# Patient Record
Sex: Male | Born: 1970 | Race: White | Hispanic: No | Marital: Married | Smoking: Light tobacco smoker
Health system: Southern US, Community
[De-identification: ages and names within clinical notes are randomized; demographics above are authoritative.]

## PROBLEM LIST (undated history)

## (undated) DIAGNOSIS — E785 Hyperlipidemia, unspecified: Secondary | ICD-10-CM

## (undated) DIAGNOSIS — Z5189 Encounter for other specified aftercare: Secondary | ICD-10-CM

## (undated) DIAGNOSIS — K219 Gastro-esophageal reflux disease without esophagitis: Secondary | ICD-10-CM

## (undated) HISTORY — DX: Encounter for other specified aftercare: Z51.89

## (undated) HISTORY — DX: Gastro-esophageal reflux disease without esophagitis: K21.9

## (undated) HISTORY — PX: HAND SURGERY: SHX662

## (undated) HISTORY — PX: KNEE ARTHROSCOPY W/ MENISCAL REPAIR: SHX1877

## (undated) HISTORY — DX: Hyperlipidemia, unspecified: E78.5

---

## 2015-05-23 ENCOUNTER — Telehealth: Payer: Self-pay | Admitting: *Deleted

## 2015-05-23 ENCOUNTER — Encounter: Payer: Self-pay | Admitting: *Deleted

## 2015-05-23 NOTE — Addendum Note (Signed)
Addended by: Noreene Larsson A on: 05/23/2015 04:29 PM   Modules accepted: Medications

## 2015-05-23 NOTE — Telephone Encounter (Signed)
Pre-Visit Call completed with patient and chart updated.   Pre-Visit Info documented in Specialty Comments under SnapShot.    

## 2015-05-23 NOTE — Telephone Encounter (Signed)
Unable to reach patient at time of Pre-Visit Call.  Left message for patient to return call when available.    

## 2015-05-24 ENCOUNTER — Encounter: Payer: Self-pay | Admitting: Medical

## 2015-05-24 ENCOUNTER — Telehealth: Payer: Self-pay | Admitting: Medical

## 2015-05-24 ENCOUNTER — Ambulatory Visit (HOSPITAL_BASED_OUTPATIENT_CLINIC_OR_DEPARTMENT_OTHER)
Admission: RE | Admit: 2015-05-24 | Discharge: 2015-05-24 | Disposition: A | Discharge status: Home / Self Care | Payer: Federal, State, Local not specified - PPO | Source: Ambulatory Visit | Attending: Medical | Admitting: Medical

## 2015-05-24 ENCOUNTER — Ambulatory Visit (INDEPENDENT_AMBULATORY_CARE_PROVIDER_SITE_OTHER): Payer: Federal, State, Local not specified - PPO | Admitting: Medical

## 2015-05-24 VITALS — BP 120/68 | HR 68 | Temp 98.8°F | Ht 72.2 in | Wt 205.8 lb

## 2015-05-24 DIAGNOSIS — Z8719 Personal history of other diseases of the digestive system: Secondary | ICD-10-CM | POA: Insufficient documentation

## 2015-05-24 DIAGNOSIS — K219 Gastro-esophageal reflux disease without esophagitis: Secondary | ICD-10-CM | POA: Insufficient documentation

## 2015-05-24 DIAGNOSIS — R0781 Pleurodynia: Secondary | ICD-10-CM | POA: Insufficient documentation

## 2015-05-24 DIAGNOSIS — F17200 Nicotine dependence, unspecified, uncomplicated: Secondary | ICD-10-CM

## 2015-05-24 DIAGNOSIS — R0789 Other chest pain: Secondary | ICD-10-CM | POA: Diagnosis not present

## 2015-05-24 DIAGNOSIS — Z720 Tobacco use: Secondary | ICD-10-CM

## 2015-05-24 HISTORY — DX: Nicotine dependence, unspecified, uncomplicated: F17.200

## 2015-05-24 HISTORY — DX: Other chest pain: R07.89

## 2015-05-24 HISTORY — DX: Personal history of other diseases of the digestive system: Z87.19

## 2015-05-24 HISTORY — DX: Pleurodynia: R07.81

## 2015-05-24 LAB — CBC WITH DIFFERENTIAL/PLATELET
Basophils Absolute: 0.1 10*3/uL (ref 0.0–0.1)
Basophils Relative: 0.9 % (ref 0.0–3.0)
Eosinophils Absolute: 0.3 10*3/uL (ref 0.0–0.7)
Eosinophils Relative: 3 % (ref 0.0–5.0)
HCT: 46.1 % (ref 39.0–52.0)
Hemoglobin: 15.4 g/dL (ref 13.0–17.0)
Lymphocytes Relative: 32.8 % (ref 12.0–46.0)
Lymphs Abs: 2.9 10*3/uL (ref 0.7–4.0)
MCHC: 33.4 g/dL (ref 30.0–36.0)
MCV: 94 fl (ref 78.0–100.0)
Monocytes Absolute: 0.7 10*3/uL (ref 0.1–1.0)
Monocytes Relative: 8 % (ref 3.0–12.0)
Neutro Abs: 4.9 10*3/uL (ref 1.4–7.7)
Neutrophils Relative %: 55.3 % (ref 43.0–77.0)
Platelets: 205 10*3/uL (ref 150.0–400.0)
RBC: 4.91 Mil/uL (ref 4.22–5.81)
RDW: 13.3 % (ref 11.5–15.5)
WBC: 8.9 10*3/uL (ref 4.0–10.5)

## 2015-05-24 LAB — TROPONIN I: TNIDX: 0.02 ug/l (ref 0.00–0.06)

## 2015-05-24 MED ORDER — AZITHROMYCIN 250 MG PO TABS: ORAL_TABLET | ORAL | Status: DC

## 2015-05-24 MED ORDER — DICLOFENAC SODIUM 75 MG PO TBEC: 75.0000 mg | DELAYED_RELEASE_TABLET | Freq: Two times a day (BID) | ORAL | Status: DC

## 2015-05-24 MED ORDER — BUPROPION HCL ER (SR) 150 MG PO TB12: ORAL_TABLET | ORAL | Status: DC

## 2015-05-24 NOTE — Assessment & Plan Note (Addendum)
Rib xrays and cxr. Diclofenac rx.

## 2015-05-24 NOTE — Progress Notes (Signed)
Pre visit review using our clinic review tool, if applicable. No additional management support is needed unless otherwise documented below in the visit note. 

## 2015-05-24 NOTE — Assessment & Plan Note (Addendum)
Stool cards x 3 for blood. If+ then GI referal. Cbc today.

## 2015-05-24 NOTE — Progress Notes (Signed)
Subjective:    Patient ID: Mark Duncan, male    DOB: 1971-07-21, 44 y.o.   MRN: [id number]  HPI  I have reviewed pt PMH, PSH, FH, Social History and Surgical History  Possible transfusion. Pt had left wrist surgery from trauma. He was 44 yrs old and general surgery to repaired  Lt wrist surgery- He had good outcome. Good rom. Good function.   Lt side wrist- sometimes at night he wakes up and feels numb. For couple of years. He is rt handed.  Gerd- Pt has been on med for couple of years. Initially when he had symptoms he had cardologist work up to make sure not cardiac. Pt had echo and stress test. Those test did appear to be negative. Pt states when he took zantac regular basis would control symptoms for most part. But then 6 months ago got atypical lt rib area pain.   Pt states last 6 months some mild constant pain left axillary pain and shoulder pain. So he went back to cardiologist and had repeat stress test and blood work. He states he ran treadmilll out. Pt will have calcium score test. But he did not get test done.   2 uncles had Mi. One before 80 yo. Other late 30's(but younger one used drugs)  Sometimes pain random at rest. Usually not with actitivity.  Pt has faint tenderness to touch. Pain directly over rib. Pt just moved to area 7 wks ago.   Pt prior cardiologist [facility]. Pt will see him for calcium  Pt all sports umpire, active, coffee, married.  Pt has no high cholesterol  Smoke- 1 pack a day. In past 2 yrs. Overall 24 yr smoker.  Also some occasional bright red blood in stool every other day for about a year.   Past Medical History  Diagnosis Date  . Blood transfusion without reported diagnosis   . GERD (gastroesophageal reflux disease)   . Hyperlipidemia     History   Social History  . Marital Status: Married    Spouse Name: N/A  . Number of Children: N/A  . Years of Education: N/A   Occupational History  . Not on file.   Social  History Main Topics  . Smoking status: Light Tobacco Smoker  . Smokeless tobacco: Former Neurosurgeon  . Alcohol Use: No  . Drug Use: Not on file  . Sexual Activity: Not on file   Other Topics Concern  . Not on file   Social History Narrative    Past Surgical History  Procedure Laterality Date  . Hand surgery    . Knee surgery Right   . Hand surgery      Glass removed  . Knee arthroscopy w/ meniscal repair      Family History  Problem Relation Age of Onset  . Heart disease Maternal Uncle   . Heart disease Maternal Grandmother     No Known Allergies  Current Outpatient Prescriptions on File Prior to Visit  Medication Sig Dispense Refill  . ranitidine (ZANTAC) 150 MG capsule Take 150 mg by mouth 2 (two) times daily.     No current facility-administered medications on file prior to visit.    BP 120/68 mmHg  Pulse 68  Temp(Src) 98.8 F (37.1 C) (Oral)  Ht 6' 0.2" (1.834 m)  Wt 205 lb 12.8 oz (93.35 kg)  BMI 27.75 kg/m2  SpO2 99%    Review of Systems  Constitutional: Negative for fever, chills, diaphoresis, activity change and fatigue.  Respiratory: Negative for cough, chest tightness and shortness of breath.   Cardiovascular: Negative for chest pain, palpitations and leg swelling.  Gastrointestinal: Negative for nausea, vomiting and abdominal pain.  Musculoskeletal: Negative for neck pain and neck stiffness.       Lt rib pain under his axillary.  Neurological: Negative for dizziness, tremors, seizures, syncope, facial asymmetry, speech difficulty, weakness, light-headedness, numbness and headaches.  Psychiatric/Behavioral: Negative for behavioral problems, confusion and agitation. The patient is not nervous/anxious.        Objective:   Physical Exam  General Mental Status- Alert. General Appearance- Not in acute distress.   Skin General: Color- Normal Color. Moisture- Normal Moisture.  Neck Carotid Arteries- Normal color. Moisture- Normal Moisture. No carotid  bruits. No JVD.  Chest and Lung Exam Auscultation: Breath Sounds:-Normal.  Cardiovascular Auscultation:Rythm- Regular. Murmurs & Other Heart Sounds:Auscultation of the heart reveals- No Murmurs.  Abdomen Inspection:-Inspeection Normal. Palpation/Percussion:Note:No mass. Palpation and Percussion of the abdomen reveal- Non Tender, Non Distended + BS, no rebound or guarding.    Neurologic Cranial Nerve exam:- CN III-XII intact(No nystagmus), symmetric smile. Strength:- 5/5 equal and symmetric strength both upper and lower extremities.  Lt shoulder- from. No pain on palpation. Thorax- no pain on palpation.  Lt axillary area no tenderness to papation. But left side rib mid lt axillary area mild tender.      Assessment & Plan:

## 2015-05-24 NOTE — Patient Instructions (Addendum)
Atypical chest pain ekg today. troponin today stat. Refer to cardiologsit and ask pt to sign release to get old records from cardiologist.  If any cardiac type symptoms then ED evaluation prior to cardologist referral.  Smoker Rx wellbutrin. Use if own attempts to stop fails.  Rib pain on left side Rib xrays and cxr. Diclofenac rx.  History of bloody stools Stool cards x 3 for blood. If+ then GI referal. Cbc today.  GERD (gastroesophageal reflux disease) Stay on zantac.    Follow up in one month  Or as needed

## 2015-05-24 NOTE — Assessment & Plan Note (Addendum)
ekg today. troponin today stat. Refer to cardiologsit and ask pt to sign release to get old records from cardiologist.  If any cardiac type symptoms then ED evaluation prior to cardologist referral.

## 2015-05-24 NOTE — Assessment & Plan Note (Signed)
Rx wellbutrin. Use if own attempts to stop fails.

## 2015-05-24 NOTE — Telephone Encounter (Signed)
Bronchitis by xray in smoker. Will rx azithromycin.

## 2015-05-24 NOTE — Assessment & Plan Note (Signed)
Stay on zantac.

## 2015-05-25 NOTE — Telephone Encounter (Signed)
PAtient notified XR shows Bronchitis. Advised ABO sent to pharmacy.

## 2015-06-25 ENCOUNTER — Ambulatory Visit: Payer: Self-pay | Admitting: Medical

## 2015-06-27 ENCOUNTER — Encounter: Payer: Self-pay | Admitting: Medical

## 2015-06-27 ENCOUNTER — Telehealth: Payer: Self-pay | Admitting: Medical

## 2015-06-27 NOTE — Telephone Encounter (Signed)
Patient no show 06/25/15 letter mailed- charge or no charge

## 2015-06-27 NOTE — Telephone Encounter (Signed)
charge 

## 2015-07-27 ENCOUNTER — Telehealth: Payer: Self-pay | Admitting: Medical

## 2015-07-27 NOTE — Telephone Encounter (Signed)
Caller name: Mark Duncan Relationship to patient: Self  Can be reached:[phone] Pharmacy:  Reason for call: Pt called in stating that he has had 5 sessions with his Therapist for his pain. Pt says that the Z patch that is prescribed isn't working. He thinks that its more of a muscle issue. Pt would like to know if he can be prescribed muscle relaxers also. Please call back to advise.

## 2015-07-28 ENCOUNTER — Encounter (HOSPITAL_BASED_OUTPATIENT_CLINIC_OR_DEPARTMENT_OTHER): Payer: Self-pay | Admitting: *Deleted

## 2015-07-28 ENCOUNTER — Telehealth: Payer: Self-pay | Admitting: Medical

## 2015-07-28 ENCOUNTER — Emergency Department (HOSPITAL_BASED_OUTPATIENT_CLINIC_OR_DEPARTMENT_OTHER)
Admission: EM | Admit: 2015-07-28 | Discharge: 2015-07-28 | Disposition: A | Discharge status: Home / Self Care | Payer: Federal, State, Local not specified - PPO | Attending: Emergency Medicine | Admitting: Emergency Medicine

## 2015-07-28 DIAGNOSIS — M545 Low back pain, unspecified: Secondary | ICD-10-CM

## 2015-07-28 DIAGNOSIS — K219 Gastro-esophageal reflux disease without esophagitis: Secondary | ICD-10-CM | POA: Insufficient documentation

## 2015-07-28 DIAGNOSIS — Z8639 Personal history of other endocrine, nutritional and metabolic disease: Secondary | ICD-10-CM | POA: Insufficient documentation

## 2015-07-28 DIAGNOSIS — Z79899 Other long term (current) drug therapy: Secondary | ICD-10-CM | POA: Diagnosis not present

## 2015-07-28 DIAGNOSIS — Z720 Tobacco use: Secondary | ICD-10-CM | POA: Diagnosis not present

## 2015-07-28 MED ORDER — DIAZEPAM 5 MG/ML IJ SOLN
10.0000 mg | Freq: Once | INTRAMUSCULAR | Status: AC
  Administered 2015-07-28: 10 mg via INTRAMUSCULAR
  Filled 2015-07-28: qty 2

## 2015-07-28 MED ORDER — DIAZEPAM 5 MG PO TABS: 5.0000 mg | ORAL_TABLET | Freq: Four times a day (QID) | ORAL | Status: DC | PRN

## 2015-07-28 NOTE — ED Provider Notes (Signed)
CSN: [id number]     Arrival date & time 07/28/15  0029 History   First MD Initiated Contact with Patient 07/28/15 0207     Chief Complaint  Patient presents with  . Back Pain     (Consider location/radiation/quality/duration/timing/severity/associated sxs/prior Treatment) HPI  This is a 44 year old male with a 9 day history of left lower back pain. He describes the pain as sharp with little radiation. He describes it as feeling like severe spasms. He has been to a chiropractor 5 or 6 times with x-rays and adjustments but has not gotten relief. He has tried ibuprofen with transient relief. He has taken some of his wife's Percocet without significant relief. He has a history of similar pain about a year ago which was relieved with an unspecified muscle relaxant. He is requesting a muscle relaxant; he does not desire any narcotics. There is no associated numbness, weakness or bowel or bladder changes. The pain is severe enough to keep him awake at night. It is worse with movement or ambulation.  Past Medical History  Diagnosis Date  . Blood transfusion without reported diagnosis   . GERD (gastroesophageal reflux disease)   . Hyperlipidemia    Past Surgical History  Procedure Laterality Date  . Hand surgery    . Knee surgery Right   . Hand surgery      Glass removed  . Knee arthroscopy w/ meniscal repair     Family History  Problem Relation Age of Onset  . Heart disease Maternal Uncle   . Heart disease Maternal Grandmother    Social History  Substance Use Topics  . Smoking status: Light Tobacco Smoker  . Smokeless tobacco: Former Neurosurgeon  . Alcohol Use: No    Review of Systems  All other systems reviewed and are negative.   Allergies  Review of patient's allergies indicates no known allergies.  Home Medications   Prior to Admission medications   Medication Sig Start Date End Date Taking? Authorizing Provider  diclofenac (VOLTAREN) 75 MG EC tablet Take 1 tablet (75 mg total)  by mouth 2 (two) times daily. 05/24/15  Yes Edward Saguier, PA-C  ranitidine (ZANTAC) 150 MG capsule Take 150 mg by mouth 2 (two) times daily.   Yes Historical Provider, MD  azithromycin (ZITHROMAX) 250 MG tablet Take 2 tablets by mouth on day 1, followed by 1 tablet by mouth daily for 4 days. 05/24/15   Ramon Dredge Saguier, PA-C  buPROPion (WELLBUTRIN SR) 150 MG 12 hr tablet 1 tab po q day x 3 days then 1 tab po bid 05/24/15   Edward Saguier, PA-C   BP 119/80 mmHg  Pulse 56  Temp(Src) 97.8 F (36.6 C) (Oral)  Resp 16  Ht 6\' 2"  (1.88 m)  Wt 205 lb (92.987 kg)  BMI 26.31 kg/m2  SpO2 98%   Physical Exam  General: Well-developed, well-nourished male in no acute distress; appearance consistent with age of record HENT: normocephalic; atraumatic Eyes: pupils equal, round and reactive to light; extraocular muscles intact Neck: supple Heart: regular rate and rhythm Lungs: clear to auscultation bilaterally Abdomen: soft; nondistended Back: Mild left lumbar tenderness; negative straight leg raise on the right; positive straight leg raise on the left at about 75 Extremities: No deformity; full range of motion; pulses normal Neurologic: Awake, alert and oriented; motor function intact in all extremities and symmetric; no facial droop Skin: Warm and dry Psychiatric: Normal mood and affect    ED Course  Procedures (including critical care time)   MDM  We will treat with Valium which is a known muscle relaxant. He has a primary care physician with whom he can follow-up.  Paula Libra, MD 07/28/15 Earle Gell

## 2015-07-28 NOTE — ED Notes (Signed)
MD at bedside. 

## 2015-07-28 NOTE — Telephone Encounter (Signed)
I saw pt request very late  Friday. Did not understand zpatch comment. Was considering sending something in on Saturday. When rechecked computer on saturday showed he had went to ED. Nothing in note I received described severity of pt pain. Will ask lpn to call pt on Monday see how he is and offer appointment sometime next week.

## 2015-07-28 NOTE — Telephone Encounter (Signed)
I saw on Saturday he had been to the ED. Will you call him and see how he is. Offer him appointment this week if needed.

## 2015-07-28 NOTE — ED Notes (Addendum)
Pt admits to back spasms to left lower/mid back area x1 week - pt has been seen by his chiropractor x6 days in the past week for adjustments and therapy, pt admits pain returns at night. Pt denies dysuria, hematuria, fever, or n/v. Pt states he has been taking his wife's percocet and voltaren w/o relief.

## 2016-04-23 ENCOUNTER — Ambulatory Visit (INDEPENDENT_AMBULATORY_CARE_PROVIDER_SITE_OTHER): Payer: Federal, State, Local not specified - PPO | Admitting: Medical

## 2016-04-23 ENCOUNTER — Encounter: Payer: Self-pay | Admitting: Medical

## 2016-04-23 VITALS — BP 100/60 | HR 68 | Temp 98.0°F | Ht 72.0 in | Wt 201.8 lb

## 2016-04-23 DIAGNOSIS — E785 Hyperlipidemia, unspecified: Secondary | ICD-10-CM | POA: Diagnosis not present

## 2016-04-23 DIAGNOSIS — R079 Chest pain, unspecified: Secondary | ICD-10-CM | POA: Diagnosis not present

## 2016-04-23 DIAGNOSIS — T148 Other injury of unspecified body region: Secondary | ICD-10-CM | POA: Diagnosis not present

## 2016-04-23 DIAGNOSIS — W57XXXA Bitten or stung by nonvenomous insect and other nonvenomous arthropods, initial encounter: Secondary | ICD-10-CM

## 2016-04-23 DIAGNOSIS — Z8719 Personal history of other diseases of the digestive system: Secondary | ICD-10-CM | POA: Diagnosis not present

## 2016-04-23 LAB — COMPREHENSIVE METABOLIC PANEL
ALT: 38 U/L (ref 0–53)
AST: 24 U/L (ref 0–37)
Albumin: 4.5 g/dL (ref 3.5–5.2)
Alkaline Phosphatase: 53 U/L (ref 39–117)
BUN: 12 mg/dL (ref 6–23)
CO2: 26 mEq/L (ref 19–32)
Calcium: 10 mg/dL (ref 8.4–10.5)
Chloride: 108 mEq/L (ref 96–112)
Creatinine, Ser: 0.92 mg/dL (ref 0.40–1.50)
GFR: 95.26 mL/min (ref >60.00)
Glucose, Bld: 100 mg/dL — ABNORMAL HIGH (ref 70–99)
Potassium: 4.2 mEq/L (ref 3.5–5.1)
Sodium: 140 mEq/L (ref 135–145)
Total Bilirubin: 0.6 mg/dL (ref 0.2–1.2)
Total Protein: 7 g/dL (ref 6.0–8.3)

## 2016-04-23 LAB — LIPID PANEL
Cholesterol: 217 mg/dL — ABNORMAL HIGH (ref 0–200)
HDL: 30.8 mg/dL — ABNORMAL LOW (ref >39.00)
LDL Cholesterol: 175 mg/dL — ABNORMAL HIGH (ref 0–99)
NonHDL: 185.94
Total CHOL/HDL Ratio: 7
Triglycerides: 57 mg/dL (ref 0.0–149.0)
VLDL: 11.4 mg/dL (ref 0.0–40.0)

## 2016-04-23 LAB — TROPONIN I: TNIDX: 0 ug/l (ref 0.00–0.06)

## 2016-04-23 MED ORDER — RANITIDINE HCL 150 MG PO CAPS: 150.0000 mg | ORAL_CAPSULE | Freq: Two times a day (BID) | ORAL | Status: DC

## 2016-04-23 NOTE — Progress Notes (Signed)
Subjective:    Patient ID: Mark Duncan, male    DOB: [birth year], 45 y.o.   MRN: [id number]  HPI   Pt in for some chest area pain he points to left side of chest. Pain feels like twinge or a shock. Pain associated with at time before after eating. But then states other night did not occur with eating. Pt states he used to take ranitidine but his cardiologist in [facility] would not refill. Pt states cardiologist there did stress test and echo. Per pt he determined was stomach issue. Pt had some pain yesterday around 2 pm. He states this occurred after treating a lawn. Pain yesterday was brief at that time. Pt thinks may be fast heart rate or palpitation. He is not sure.  On first visit I saw him had wanted to refer him to cardiologist.(I don't think we have those old records) At that time he thought it was unnecessary. Pt admits very poor diet. Pt has family history of heart disease in uncle.   Pt also reports getting bit by a tick. About one month ago.    Review of Systems  Constitutional: Negative for fever, chills and fatigue.  HENT: Negative for dental problem, ear discharge and hearing loss.   Respiratory: Negative for cough, chest tightness, shortness of breath and wheezing.   Cardiovascular: Negative for chest pain and palpitations.  Gastrointestinal: Negative for abdominal pain.  Musculoskeletal: Negative for back pain.       No pain recently.  Skin: Negative for rash.  Neurological: Negative for dizziness, speech difficulty, weakness, numbness and headaches.  Hematological: Negative for adenopathy. Does not bruise/bleed easily.  Psychiatric/Behavioral: Negative for confusion, dysphoric mood and agitation. The patient is not nervous/anxious.     Past Medical History  Diagnosis Date  . Blood transfusion without reported diagnosis   . GERD (gastroesophageal reflux disease)   . Hyperlipidemia      Social History   Social History  . Marital Status: Married    Spouse  Name: N/A  . Number of Children: N/A  . Years of Education: N/A   Occupational History  . Not on file.   Social History Main Topics  . Smoking status: Light Tobacco Smoker  . Smokeless tobacco: Former Neurosurgeon  . Alcohol Use: No  . Drug Use: Not on file  . Sexual Activity: Not on file   Other Topics Concern  . Not on file   Social History Narrative    Past Surgical History  Procedure Laterality Date  . Hand surgery      Glass removed  . Knee arthroscopy w/ meniscal repair      Family History  Problem Relation Age of Onset  . Heart disease Maternal Uncle   . Heart disease Maternal Grandmother     No Known Allergies  Current Outpatient Prescriptions on File Prior to Visit  Medication Sig Dispense Refill  . diazepam (VALIUM) 5 MG tablet Take 1 tablet (5 mg total) by mouth every 6 (six) hours as needed for muscle spasms. 20 tablet 0  . diclofenac (VOLTAREN) 75 MG EC tablet Take 1 tablet (75 mg total) by mouth 2 (two) times daily. 30 tablet 0  . ranitidine (ZANTAC) 150 MG capsule Take 150 mg by mouth 2 (two) times daily.    . [DISCONTINUED] buPROPion (WELLBUTRIN SR) 150 MG 12 hr tablet 1 tab po q day x 3 days then 1 tab po bid 63 tablet 1   No current facility-administered medications on file  prior to visit.    BP 100/60 mmHg  Pulse 68  Temp(Src) 98 F (36.7 C) (Oral)  Ht 6' (1.829 m)  Wt 201 lb 12.8 oz (91.536 kg)  BMI 27.36 kg/m2  SpO2 98%       Objective:   Physical Exam  General Mental Status- Alert. General Appearance- Not in acute distress.   Skin General: Color- Normal Color. Moisture- Normal Moisture.  Neck Carotid Arteries- Normal color. Moisture- Normal Moisture. No carotid bruits. No JVD.  Chest and Lung Exam Auscultation: Breath Sounds:-Normal.  Cardiovascular Auscultation:Rythm- Regular. Murmurs & Other Heart Sounds:Auscultation of the heart reveals- No Murmurs.  Abdomen Inspection:-Inspeection Normal. Palpation/Percussion:Note:No  mass. Palpation and Percussion of the abdomen reveal- Non Tender, Non Distended + BS, no rebound or guarding.  Neurologic Cranial Nerve exam:- CN III-XII intact(No nystagmus), symmetric smile. Strength:- 5/5 equal and symmetric strength both upper and lower extremities.  Rt lower leg- medial aspect below knee. Small scab area present where tick was attached. No redness. No induration. No portion of tick seen.  Anterior thorax- left pectoralis area faint tender to palpation.      Assessment & Plan:  ekg was nsr  For your atypical chest pain will go ahead and refer you to cardiologist. If any severe type pain during interim then ED evaluation.  For tick bite will get tick bite studies. Decided not to rx antibiotic just yet. Sufficient time has passed that studies should be valid.  Will get lipid panel today.   For history of gerd will refill his ranitidine.   Follow up in 1 month or as needed  After above he reminds me he never got ifob done. He had some bright red blood in his stools. Will order ifob again. If + will refer to GI.  Lataysha Vohra, Ramon DredgeEdward, PA-C

## 2016-04-23 NOTE — Patient Instructions (Addendum)
For your atypical chest pain will go ahead and refer you to cardiologist. If any severe type pain during interim then ED evaluation.  For tick bite will get tick bite studies. Decided not to rx antibiotic just yet. Sufficient time has passed that studies should be valid.  Will get lipid panel today.   For history of gerd will refill his ranitidine.   Bright red blood in his stools. Will order ifob again. If + will refer to GI.  Follow up in 1 month(schedule cpe) or as needed

## 2016-04-23 NOTE — Progress Notes (Signed)
Pre visit review using our clinic review tool, if applicable. No additional management support is needed unless otherwise documented below in the visit note. 

## 2016-04-24 LAB — LYME ABY, WSTRN BLT IGG & IGM W/BANDS
B burgdorferi IgG Abs (IB): NEGATIVE
B burgdorferi IgM Abs (IB): POSITIVE — AB
Lyme Disease 18 kD IgG: NONREACTIVE
Lyme Disease 23 kD IgG: NONREACTIVE
Lyme Disease 23 kD IgM: NONREACTIVE
Lyme Disease 28 kD IgG: NONREACTIVE
Lyme Disease 30 kD IgG: NONREACTIVE
Lyme Disease 39 kD IgG: NONREACTIVE
Lyme Disease 39 kD IgM: REACTIVE — AB
Lyme Disease 41 kD IgG: NONREACTIVE
Lyme Disease 41 kD IgM: REACTIVE — AB
Lyme Disease 45 kD IgG: NONREACTIVE
Lyme Disease 58 kD IgG: NONREACTIVE
Lyme Disease 66 kD IgG: NONREACTIVE
Lyme Disease 93 kD IgG: NONREACTIVE

## 2016-04-24 LAB — REFLEX RMSF IGG TITER: RMSF IgG Titer: 1:64 {titer} — ABNORMAL HIGH

## 2016-04-24 LAB — ROCKY MTN SPOTTED FVR ABS PNL(IGG+IGM)
RMSF IgG: DETECTED — AB
RMSF IgM: NOT DETECTED

## 2016-04-24 LAB — LYME AB/WESTERN BLOT REFLEX: B burgdorferi Ab IgG+IgM: <0.9 Index (ref <0.90)

## 2016-04-29 ENCOUNTER — Telehealth: Payer: Self-pay | Admitting: Medical

## 2016-04-29 DIAGNOSIS — K921 Melena: Secondary | ICD-10-CM

## 2016-04-29 MED ORDER — DOXYCYCLINE HYCLATE 100 MG PO TABS: 100.0000 mg | ORAL_TABLET | Freq: Two times a day (BID) | ORAL | Status: DC

## 2016-04-29 NOTE — Telephone Encounter (Signed)
Referral to GI placed

## 2016-04-29 NOTE — Telephone Encounter (Signed)
Talked with pt on tick studies. He feel fine. 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 Rx doxy. rx advisement given.

## 2016-05-01 ENCOUNTER — Encounter: Payer: Federal, State, Local not specified - PPO | Admitting: Physician Assistant

## 2016-05-01 NOTE — Progress Notes (Deleted)
Patient ID: Mark Duncan, male   DOB: 05-02-1971, 45 y.o.   MRN: [id number]    Cardiology Office Note    Date:  05/01/2016   ID:  Mark MylarDonald C Duncan, DOB 05-02-1971, MRN [id number]  PCP:  Esperanza RichtersSaguier, Edward, PA-C  Cardiologist:  New (Dr. Eden EmmsNishan)  Chief Complaint: chest pain and establish care  History of Present Illness:   Mark Duncan is a 45 y.o. male HL and GERD who referred by PCP for evaluation of chest pain.    The [place]*. Echo and stress test were normal per patient. No result available.    Seen by PCP 04/23/16 for atypical chest pain. At that time he was treated with doxy for tick bite (had positive RMSF). LDL 175.    2 uncles had MI. One before 44 yo. Other late 5930's (but younger one used drugs)   Past Medical History  Diagnosis Date  . Blood transfusion without reported diagnosis   . GERD (gastroesophageal reflux disease)   . Hyperlipidemia     Past Surgical History  Procedure Laterality Date  . Hand surgery      Glass removed  . Knee arthroscopy w/ meniscal repair      Current Medications: Outpatient Prescriptions Prior to Visit  Medication Sig Dispense Refill  . diclofenac (VOLTAREN) 75 MG EC tablet Take 75 mg by mouth 2 (two) times daily.    Marland Kitchen. doxycycline (VIBRA-TABS) 100 MG tablet Take 1 tablet (100 mg total) by mouth 2 (two) times daily. 20 tablet 0  . ranitidine (ZANTAC) 150 MG capsule Take 1 capsule (150 mg total) by mouth 2 (two) times daily. 60 capsule 2   No facility-administered medications prior to visit.     Allergies:   Review of patient's allergies indicates no known allergies.   Social History   Social History  . Marital Status: Married    Spouse Name: N/A  . Number of Children: N/A  . Years of Education: N/A   Social History Main Topics  . Smoking status: Light Tobacco Smoker  . Smokeless tobacco: Former NeurosurgeonUser  . Alcohol Use: No  . Drug Use: Not on file  . Sexual  Activity: Not on file   Other Topics Concern  . Not on file   Social History Narrative     Family History:  The patient's ***family history includes Heart disease in his maternal grandmother and maternal uncle.   ROS:   Please see the history of present illness.    ROS All other systems reviewed and are negative.   PHYSICAL EXAM:   VS:  There were no vitals taken for this visit.   GEN: Well nourished, well developed, in no acute distress HEENT: normal Neck: no JVD, carotid bruits, or masses Cardiac: ***RRR; no murmurs, rubs, or gallops,no edema  Respiratory:  clear to auscultation bilaterally, normal work of breathing GI: soft, nontender, nondistended, + BS MS: no deformity or atrophy Skin: warm and dry, no rash Neuro:  Alert and Oriented x 3, Strength and sensation are intact Psych: euthymic mood, full affect  Wt Readings from Last 3 Encounters:  04/23/16 201 lb 12.8 oz (91.536 kg)  07/28/15 205 lb (92.987 kg)  05/24/15 205 lb 12.8 oz (93.35 kg)      Studies/Labs Reviewed:   EKG:  EKG is ordered today.  The ekg ordered today demonstrates ***  Recent Labs: 05/24/2015: Hemoglobin 15.4; Platelets 205.0 04/23/2016: ALT 38; BUN 12; Creatinine, Ser  0.92; Potassium 4.2; Sodium 140   Lipid Panel    Component Value Date/Time   CHOL 217* 04/23/2016 0848   TRIG 57.0 04/23/2016 0848   HDL 30.80* 04/23/2016 0848   CHOLHDL 7 04/23/2016 0848   VLDL 11.4 04/23/2016 0848   LDLCALC 175* 04/23/2016 0848    Additional studies/ records that were reviewed today include:   Echocardiogram:  Cardiac Catheterization:     ASSESSMENT & PLAN:    1. ***    Medication Adjustments/Labs and Tests Ordered: Current medicines are reviewed at length with the patient today.  Concerns regarding medicines are outlined above.  Medication changes, Labs and Tests ordered today are listed in the Patient Instructions below. There are no Patient Instructions on file for this visit.    Lorelei Pont, [place]  05/01/2016 8:06 AM    [facility] [address] [place], [place], [place]  [address] Phone: [phone]; Fax: [phone]

## 2016-05-01 NOTE — Progress Notes (Signed)
This encounter was created in error - please disregard.

## 2016-05-02 ENCOUNTER — Ambulatory Visit: Payer: Federal, State, Local not specified - PPO | Admitting: Internal Medicine

## 2016-05-08 ENCOUNTER — Encounter: Payer: Self-pay | Admitting: Physician Assistant

## 2016-05-21 ENCOUNTER — Other Ambulatory Visit: Payer: Federal, State, Local not specified - PPO

## 2016-05-22 ENCOUNTER — Ambulatory Visit: Payer: Federal, State, Local not specified - PPO | Admitting: Internal Medicine

## 2016-05-22 ENCOUNTER — Other Ambulatory Visit (INDEPENDENT_AMBULATORY_CARE_PROVIDER_SITE_OTHER): Payer: Federal, State, Local not specified - PPO

## 2016-05-22 DIAGNOSIS — Z8719 Personal history of other diseases of the digestive system: Secondary | ICD-10-CM

## 2016-05-22 LAB — FECAL OCCULT BLOOD, IMMUNOCHEMICAL: Fecal Occult Bld: NEGATIVE

## 2016-06-04 ENCOUNTER — Encounter: Payer: Federal, State, Local not specified - PPO | Admitting: Medical

## 2016-06-04 DIAGNOSIS — Z0289 Encounter for other administrative examinations: Secondary | ICD-10-CM

## 2016-06-04 NOTE — Progress Notes (Signed)
This encounter was created in error - please disregard.

## 2016-06-09 ENCOUNTER — Encounter: Payer: Self-pay | Admitting: Medical

## 2016-11-10 ENCOUNTER — Telehealth: Payer: Self-pay | Admitting: Medical

## 2016-11-10 NOTE — Telephone Encounter (Signed)
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 I gave him doxycycline. He needs recheck. Not convinced that these areas that itch from prior tick bite. He was treated with doxy so I doubt. So needs office visit to evaluate the new bumps.

## 2016-11-10 NOTE — Telephone Encounter (Signed)
The patient will need an appointment with Ramon DredgeEdward within the next week.

## 2016-11-10 NOTE — Telephone Encounter (Signed)
Please advise 

## 2016-11-10 NOTE — Telephone Encounter (Signed)
Patient's wife called stating that the patient was bit by a tick a few months ago and Ramon Dredgedward diagnosed him with [facility]. Patient now has two bite marks in a straight line from the original bite and they are very itchy. Patient has questions regarding this. Please advise.    Patient phone: [phone]

## 2016-11-10 NOTE — Telephone Encounter (Signed)
LVM to inform patient that he needs to be seen to have this evaluated.

## 2017-01-02 ENCOUNTER — Ambulatory Visit (INDEPENDENT_AMBULATORY_CARE_PROVIDER_SITE_OTHER): Payer: Federal, State, Local not specified - PPO | Admitting: Medical

## 2017-01-02 ENCOUNTER — Encounter: Payer: Self-pay | Admitting: Medical

## 2017-01-02 VITALS — BP 108/65 | HR 92 | Temp 98.0°F | Resp 16 | Ht 72.0 in | Wt 213.4 lb

## 2017-01-02 DIAGNOSIS — R197 Diarrhea, unspecified: Secondary | ICD-10-CM | POA: Diagnosis not present

## 2017-01-02 DIAGNOSIS — R5383 Other fatigue: Secondary | ICD-10-CM

## 2017-01-02 DIAGNOSIS — M791 Myalgia, unspecified site: Secondary | ICD-10-CM

## 2017-01-02 DIAGNOSIS — J020 Streptococcal pharyngitis: Secondary | ICD-10-CM

## 2017-01-02 DIAGNOSIS — J111 Influenza due to unidentified influenza virus with other respiratory manifestations: Secondary | ICD-10-CM

## 2017-01-02 DIAGNOSIS — R112 Nausea with vomiting, unspecified: Secondary | ICD-10-CM | POA: Diagnosis not present

## 2017-01-02 LAB — POC INFLUENZA A&B (BINAX/QUICKVUE)
Influenza A, POC: NEGATIVE
Influenza B, POC: POSITIVE — AB

## 2017-01-02 LAB — CBC WITH DIFFERENTIAL/PLATELET
Basophils Absolute: 108 cells/uL (ref 0–200)
Basophils Relative: 1 %
Eosinophils Absolute: 432 cells/uL (ref 15–500)
Eosinophils Relative: 4 %
HCT: 48.6 % (ref 38.5–50.0)
Hemoglobin: 16.4 g/dL (ref 13.2–17.1)
Lymphocytes Relative: 30 %
Lymphs Abs: 3240 cells/uL (ref 850–3900)
MCH: 31.8 pg (ref 27.0–33.0)
MCHC: 33.7 g/dL (ref 32.0–36.0)
MCV: 94.4 fL (ref 80.0–100.0)
MPV: 11.7 fL (ref 7.5–12.5)
Monocytes Absolute: 1080 cells/uL — ABNORMAL HIGH (ref 200–950)
Monocytes Relative: 10 %
Neutro Abs: 5940 cells/uL (ref 1500–7800)
Neutrophils Relative %: 55 %
Platelets: 215 10*3/uL (ref 140–400)
RBC: 5.15 MIL/uL (ref 4.20–5.80)
RDW: 13.1 % (ref 11.0–15.0)
WBC: 10.8 10*3/uL (ref 3.8–10.8)

## 2017-01-02 LAB — POCT RAPID STREP A (OFFICE): Rapid Strep A Screen: POSITIVE — AB

## 2017-01-02 MED ORDER — AZITHROMYCIN 250 MG PO TABS: ORAL_TABLET | ORAL | 0 refills | Status: DC

## 2017-01-02 MED ORDER — ONDANSETRON 8 MG PO TBDP: 8.0000 mg | ORAL_TABLET | Freq: Three times a day (TID) | ORAL | 0 refills | Status: DC | PRN

## 2017-01-02 MED ORDER — OSELTAMIVIR PHOSPHATE 75 MG PO CAPS: 75.0000 mg | ORAL_CAPSULE | Freq: Two times a day (BID) | ORAL | 0 refills | Status: DC

## 2017-01-02 MED ORDER — PROMETHAZINE HCL 12.5 MG PO TABS: 12.5000 mg | ORAL_TABLET | Freq: Three times a day (TID) | ORAL | 0 refills | Status: DC | PRN

## 2017-01-02 NOTE — Progress Notes (Signed)
Pre visit review using our clinic review tool, if applicable. No additional management support is needed unless otherwise documented below in the visit note/SLS  

## 2017-01-02 NOTE — Progress Notes (Signed)
Subjective:    Patient ID: Mark Duncan, male    DOB: 02-10-1971, 46 y.o.   MRN: [id number]  HPI  Pt in with some loose stools since Monday. Getting more formed today and yesterday. Pt was nausea and vomiting as well but worse last night. Even before illness he was feeling some nausea. Last 2 days severe body aches.    St for 5 days. Mild.   Pt had some fatigue, chills, and fever. His worst symptoms is body.     Review of Systems  Constitutional: Positive for chills, fatigue and fever.  HENT: Positive for sore throat. Negative for congestion, postnasal drip, sinus pain, sinus pressure, tinnitus and trouble swallowing.   Respiratory: Negative for cough, chest tightness, shortness of breath and wheezing.   Cardiovascular: Negative for chest pain and palpitations.  Gastrointestinal: Positive for abdominal pain, diarrhea, nausea and vomiting. Negative for abdominal distention, constipation and rectal pain.  Genitourinary: Negative for dysuria.  Musculoskeletal: Positive for back pain and myalgias. Negative for arthralgias, neck pain and neck stiffness.  Skin: Negative for rash.  Neurological: Negative for dizziness, tremors, seizures, speech difficulty, weakness and headaches.  Hematological: Negative for adenopathy. Does not bruise/bleed easily.  Psychiatric/Behavioral: Negative for behavioral problems and confusion.   Past Medical History:  Diagnosis Date  . Blood transfusion without reported diagnosis   . GERD (gastroesophageal reflux disease)   . Hyperlipidemia      Social History   Social History  . Marital status: Married    Spouse name: N/A  . Number of children: N/A  . Years of education: N/A   Occupational History  . Not on file.   Social History Main Topics  . Smoking status: Light Tobacco Smoker  . Smokeless tobacco: Former NeurosurgeonUser  . Alcohol use No  . Drug use: Unknown  . Sexual activity: Not on file   Other Topics Concern  . Not on file   Social  History Narrative  . No narrative on file    Past Surgical History:  Procedure Laterality Date  . HAND SURGERY     Glass removed  . KNEE ARTHROSCOPY W/ MENISCAL REPAIR      Family History  Problem Relation Age of Onset  . Heart disease Maternal Uncle   . Heart disease Maternal Grandmother     No Known Allergies  Current Outpatient Prescriptions on File Prior to Visit  Medication Sig Dispense Refill  . diclofenac (VOLTAREN) 75 MG EC tablet Take 75 mg by mouth 2 (two) times daily.    . ranitidine (ZANTAC) 150 MG capsule Take 1 capsule (150 mg total) by mouth 2 (two) times daily. 60 capsule 2  . [DISCONTINUED] buPROPion (WELLBUTRIN SR) 150 MG 12 hr tablet 1 tab po q day x 3 days then 1 tab po bid 63 tablet 1   No current facility-administered medications on file prior to visit.     BP 108/65 (BP Location: Left Arm, Patient Position: Sitting, Cuff Size: Large)   Pulse 92   Temp 98 F (36.7 C) (Oral)   Resp 16   Ht 6' (1.829 m)   Wt 213 lb 6 oz (96.8 kg)   SpO2 98%   BMI 28.94 kg/m       Objective:   Physical Exam   General  Mental Status - Alert. General Appearance - Well groomed. Not in acute distress.  Skin Rashes- No Rashes.  HEENT Head- Normal. Ear Auditory Canal - Left- Normal. Right - Normal.Tympanic Membrane- Left- Normal.  Right- Normal. Eye Sclera/Conjunctiva- Left- Normal. Right- Normal. Nose & Sinuses Nasal Mucosa- Left-  Boggy and Congested. Right-  Boggy and  Congested.Bilateral no maxillary and no  frontal sinus pressure. Mouth & Throat Lips: Upper Lip- Normal: no dryness, cracking, pallor, cyanosis, or vesicular eruption. Lower Lip-Normal: no dryness, cracking, pallor, cyanosis or vesicular eruption. Buccal Mucosa- Bilateral- No Aphthous ulcers. Oropharynx- No Discharge or Erythema. Tonsils: Characteristics- Bilateral- bright  Erythema and  Congestion. Size/Enlargement- Bilateral- !+ enlargement. Discharge- bilateral-None.  Neck Neck- Supple.  No Masses.   Chest and Lung Exam Auscultation: Breath Sounds:-Clear even and unlabored.  Cardiovascular Auscultation:Rythm- Regular, rate and rhythm. Murmurs & Other Heart Sounds:Ausculatation of the heart reveal- No Murmurs.  Lymphatic Head & Neck General Head & Neck Lymphatics: Bilateral: Description- No Localized lymphadenopathy.  Abdomen- faint tenderness to area left of umbilicus. No rebound or guarding +bs.  Back - no cva tenderness       Assessment & Plan:  Flu type b. Rx tamiflu. Rest, hydrate and tylenol for fever. Use ibuprofen if needed.  For strep throat rx azithromycin. If st persists past 5 days could switch.  Bland food and hydrate well.  Rx zofran for nausea or  vomiting  Symptoms should gradually improve. If not let us know.  Follow up in 7 days or as needed  Hero Kulish, Ramon Dredge, [employer]

## 2017-01-02 NOTE — Patient Instructions (Addendum)
Flu type b. Rx tamiflu. Rest, hydrate and tylenol for fever. Use ibuprofen if needed.  For strep throat rx azithromycin. If st persists past 5 days could switch.  Bland food and hydrate well.  Rx zofran for nausea or  vomiting  Symptoms should gradually improve. If not let us know.  Follow up in 7 days or as needed

## 2017-01-03 LAB — COMPREHENSIVE METABOLIC PANEL
ALT: 20 U/L (ref 9–46)
AST: 17 U/L (ref 10–40)
Albumin: 4.2 g/dL (ref 3.6–5.1)
Alkaline Phosphatase: 60 U/L (ref 40–115)
BUN: 11 mg/dL (ref 7–25)
CO2: 22 mmol/L (ref 20–31)
Calcium: 9.8 mg/dL (ref 8.6–10.3)
Chloride: 110 mmol/L (ref 98–110)
Creat: 0.92 mg/dL (ref 0.60–1.35)
Glucose, Bld: 86 mg/dL (ref 65–99)
Potassium: 3.8 mmol/L (ref 3.5–5.3)
Sodium: 141 mmol/L (ref 135–146)
Total Bilirubin: 0.4 mg/dL (ref 0.2–1.2)
Total Protein: 6.7 g/dL (ref 6.1–8.1)

## 2017-02-23 ENCOUNTER — Ambulatory Visit (HOSPITAL_BASED_OUTPATIENT_CLINIC_OR_DEPARTMENT_OTHER)
Admission: RE | Admit: 2017-02-23 | Discharge: 2017-02-23 | Disposition: A | Discharge status: Home / Self Care | Payer: Federal, State, Local not specified - PPO | Source: Ambulatory Visit | Attending: Medical | Admitting: Medical

## 2017-02-23 ENCOUNTER — Ambulatory Visit (INDEPENDENT_AMBULATORY_CARE_PROVIDER_SITE_OTHER): Payer: Federal, State, Local not specified - PPO | Admitting: Medical

## 2017-02-23 ENCOUNTER — Encounter: Payer: Self-pay | Admitting: Medical

## 2017-02-23 VITALS — BP 119/69 | HR 65 | Temp 98.3°F | Resp 16 | Ht 72.0 in | Wt 214.0 lb

## 2017-02-23 DIAGNOSIS — L989 Disorder of the skin and subcutaneous tissue, unspecified: Secondary | ICD-10-CM | POA: Diagnosis not present

## 2017-02-23 DIAGNOSIS — R0781 Pleurodynia: Secondary | ICD-10-CM | POA: Insufficient documentation

## 2017-02-23 DIAGNOSIS — F17200 Nicotine dependence, unspecified, uncomplicated: Secondary | ICD-10-CM | POA: Diagnosis not present

## 2017-02-23 DIAGNOSIS — R739 Hyperglycemia, unspecified: Secondary | ICD-10-CM

## 2017-02-23 DIAGNOSIS — Z87891 Personal history of nicotine dependence: Secondary | ICD-10-CM | POA: Insufficient documentation

## 2017-02-23 DIAGNOSIS — R05 Cough: Secondary | ICD-10-CM | POA: Diagnosis not present

## 2017-02-23 LAB — COMPREHENSIVE METABOLIC PANEL
ALT: 11 U/L (ref 0–53)
AST: 12 U/L (ref 0–37)
Albumin: 4 g/dL (ref 3.5–5.2)
Alkaline Phosphatase: 55 U/L (ref 39–117)
BUN: 12 mg/dL (ref 6–23)
CO2: 26 mEq/L (ref 19–32)
Calcium: 9.3 mg/dL (ref 8.4–10.5)
Chloride: 111 mEq/L (ref 96–112)
Creatinine, Ser: 0.9 mg/dL (ref 0.40–1.50)
GFR: 97.33 mL/min (ref >60.00)
Glucose, Bld: 106 mg/dL — ABNORMAL HIGH (ref 70–99)
Potassium: 4.2 mEq/L (ref 3.5–5.1)
Sodium: 140 mEq/L (ref 135–145)
Total Bilirubin: 0.4 mg/dL (ref 0.2–1.2)
Total Protein: 6.3 g/dL (ref 6.0–8.3)

## 2017-02-23 LAB — TROPONIN I: TNIDX: 0 ug/l (ref 0.00–0.06)

## 2017-02-23 MED ORDER — DICLOFENAC SODIUM 75 MG PO TBEC: 75.0000 mg | DELAYED_RELEASE_TABLET | Freq: Two times a day (BID) | ORAL | 0 refills | Status: DC

## 2017-02-23 MED ORDER — KETOROLAC TROMETHAMINE 60 MG/2ML IM SOLN
60.0000 mg | Freq: Once | INTRAMUSCULAR | Status: AC
  Administered 2017-02-23: 60 mg via INTRAMUSCULAR

## 2017-02-23 NOTE — Patient Instructions (Addendum)
For your left rib area pain and history of smoking will get cxr and left rib xray.  Your cough seems at this point allergy related as responds to claritin but follow cxr closely.  Currently pan most likely represent muscle pain. Will give you toradol 60 mg im and see if pain resolves. Watch for any rash in area. If any occur let us know immediatley.   Based on your atypical rib pain in past and shoulder pain with negative work up of heart thought best to get repeat ekg today. Follow troponin. May need to refer you to cardiolgist again.  Referring to dermatologist to evaluate moles.  Follow up in 7 days or as needed

## 2017-02-23 NOTE — Progress Notes (Signed)
Subjective:    Patient ID: Mark Duncan, male    DOB: [birth year], 46 y.o.   MRN: [id number]  HPI   Pt in for follow up.  Pt states some mild dry cough since allergies have come. Pt taking claritin equate version. Helps with cough.  Pt also states he had left  Posterior rib area pain that is tender to touch. Also he will have twinge area pain. Pain is rib area 7-8 days ago. No skin rash. No vesicle eruption. He states feels little swollen. Pain not worsened on movement.   Pt states pain is in left lower rib and latisimus dorsi area.  Pt tried some naproxyn for pain but it did not help.   Pt in past had work up for heat for gerd, rib pain and some intermittent shoulder pain.  But no shoulder pain now. Pt had moderate extensive work up for these symptoms in [place] by prior cardiologist.  I had referred pt to cardiologist for caution in past but locally he did not g.     Review of Systems  Constitutional: Negative for chills and fatigue.  HENT: Positive for congestion. Negative for ear discharge, rhinorrhea, sinus pain, sinus pressure, sneezing and sore throat.        Seasonal allergy hx.  Respiratory: Positive for cough.        Rare cough  Gastrointestinal: Negative for abdominal distention, abdominal pain, anal bleeding, blood in stool, constipation, diarrhea and nausea.       Pt not associated with meals presently.  Musculoskeletal: Negative for back pain.       See hpi.  Neurological: Negative for dizziness, syncope, speech difficulty, weakness and headaches.  Hematological: Negative for adenopathy. Does not bruise/bleed easily.  Psychiatric/Behavioral: Negative for behavioral problems and confusion.    Past Medical History:  Diagnosis Date  . Blood transfusion without reported diagnosis   . GERD (gastroesophageal reflux disease)   . Hyperlipidemia      Social History   Social History  . Marital status: Married    Spouse name: N/A  . Number of children: N/A   . Years of education: N/A   Occupational History  . Not on file.   Social History Main Topics  . Smoking status: Light Tobacco Smoker  . Smokeless tobacco: Former NeurosurgeonUser  . Alcohol use No  . Drug use: Unknown  . Sexual activity: Not on file   Other Topics Concern  . Not on file   Social History Narrative  . No narrative on file    Past Surgical History:  Procedure Laterality Date  . HAND SURGERY     Glass removed  . KNEE ARTHROSCOPY W/ MENISCAL REPAIR      Family History  Problem Relation Age of Onset  . Heart disease Maternal Uncle   . Heart disease Maternal Grandmother     Allergies  Allergen Reactions  . Fish Allergy Hives, Itching and Swelling    Grouper Only    Current Outpatient Prescriptions on File Prior to Visit  Medication Sig Dispense Refill  . ranitidine (ZANTAC) 150 MG capsule Take 1 capsule (150 mg total) by mouth 2 (two) times daily. 60 capsule 2  . promethazine (PHENERGAN) 12.5 MG tablet Take 1 tablet (12.5 mg total) by mouth every 8 (eight) hours as needed for nausea or vomiting. (Patient not taking: Reported on 02/23/2017) 12 tablet 0  . [DISCONTINUED] buPROPion (WELLBUTRIN SR) 150 MG 12 hr tablet 1 tab po q day x 3 days then  1 tab po bid 63 tablet 1   No current facility-administered medications on file prior to visit.     BP 119/69 (BP Location: Right Arm, Patient Position: Sitting, Cuff Size: Large)   Pulse 65   Temp 98.3 F (36.8 C) (Oral)   Resp 16   Ht 6' (1.829 m)   Wt 214 lb (97.1 kg)   SpO2 98%   BMI 29.02 kg/m      Objective:   Physical Exam  General Mental Status- Alert. General Appearance- Not in acute distress.   Heent- negative except boggy turbinates and pnd.  Skin Scattered small dark moles on his body. Some left arm and side thorax area.  Neck Carotid Arteries- Normal color. Moisture- Normal Moisture. No carotid bruits. No JVD.  Chest and Lung Exam Auscultation: Breath  Sounds:-Normal.  Cardiovascular Auscultation:Rythm- Regular. Murmurs & Other Heart Sounds:Auscultation of the heart reveals- No Murmurs.  Abdomen Inspection:-Inspeection Normal. Palpation/Percussion:Note:No mass. Palpation and Percussion of the abdomen reveal- Non Tender, Non Distended + BS, no rebound or guarding.    Neurologic Cranial Nerve exam:- CN III-XII intact(No nystagmus), symmetric smile. Strength:- 5/5 equal and symmetric strength both upper and lower extremities.  Lt shoulder- no pain on range of motion. NO pain on palpation.  Lt side ribs- tender to palpation directly in area just under latisimuss dorsi. Pain moderate to severe on direct palpation. No rash or vesicular eruption seen.     Assessment & Plan:  ekg showed bradycardia but no ischemic changes. Compared to prior. Pt did rest a while before study.  For your left rib area pain and history of smoking will get cxr and left rib xray.  Your cough seems at this point allergy related as responds to claritin but follow cxr closely.  Currently pan most likely represent muscle pain. Will give you toradol 60 mg im and see if pain resolves. Watch for any rash in area. If any occur let us know immediatley.   Based on your atypical rib pain in past and shoulder pain with negative work up of heart thought best to get repeat ekg today. Follow troponin. May need to refer you to cardiolgist again.  Referring to dermatologist to evaluate moles.  Follow up in 7 days or as needed  Bellami Farrelly, Ramon Dredge, [employer]

## 2017-02-23 NOTE — Progress Notes (Signed)
Pre visit review using our clinic review tool, if applicable. No additional management support is needed unless otherwise documented below in the visit note/SLS  

## 2017-02-25 ENCOUNTER — Encounter: Payer: Self-pay | Admitting: Medical

## 2017-03-02 ENCOUNTER — Ambulatory Visit: Payer: Federal, State, Local not specified - PPO | Admitting: Medical

## 2017-03-02 ENCOUNTER — Telehealth: Payer: Self-pay | Admitting: Medical

## 2017-03-02 NOTE — Telephone Encounter (Signed)
Pt's family member called in to cancel pt's appt. She says that he has to work and will not be able to make his appt.    vm left at 8:49a.

## 2017-03-03 NOTE — Telephone Encounter (Signed)
No charge. 

## 2017-12-02 ENCOUNTER — Ambulatory Visit: Payer: Federal, State, Local not specified - PPO | Admitting: Family Medicine

## 2017-12-02 ENCOUNTER — Encounter: Payer: Self-pay | Admitting: Family Medicine

## 2017-12-02 VITALS — BP 108/68 | HR 68 | Temp 97.8°F | Ht 74.0 in | Wt 198.4 lb

## 2017-12-02 DIAGNOSIS — K529 Noninfective gastroenteritis and colitis, unspecified: Secondary | ICD-10-CM

## 2017-12-02 LAB — POCT INFLUENZA A/B
Influenza A, POC: NEGATIVE
Influenza B, POC: NEGATIVE

## 2017-12-02 MED ORDER — ONDANSETRON HCL 4 MG PO TABS: 4.0000 mg | ORAL_TABLET | Freq: Three times a day (TID) | ORAL | 0 refills | Status: DC | PRN

## 2017-12-02 NOTE — Progress Notes (Signed)
Pre visit review using our clinic review tool, if applicable. No additional management support is needed unless otherwise documented below in the visit note. 

## 2017-12-02 NOTE — Progress Notes (Signed)
Chief Complaint  Patient presents with  . Nausea  . Diarrhea  . Headache    Mark Duncan here for a sick visit.  Duration: 1 day  Associated symptoms: HA, myalgia and nausea/diarrhea Denies: rhinorrhea, ear pain, ear drainage, sore throat, shortness of breath, vomiting, bleeding, abd pain, and fevers Treatment to date: Ranitidine, ibuprofen Sick contacts: Yes- nieces had flu  ROS:  Const: Denies fevers HEENT: As noted in HPI Lungs: No SOB  Past Medical History:  Diagnosis Date  . Blood transfusion without reported diagnosis   . GERD (gastroesophageal reflux disease)   . Hyperlipidemia    Family History  Problem Relation Age of Onset  . Heart disease Maternal Uncle   . Heart disease Maternal Grandmother     BP 108/68 (BP Location: Left Arm, Patient Position: Sitting, Cuff Size: Normal)   Pulse 68   Temp 97.8 F (36.6 C) (Oral)   Ht 6\' 2"  (1.88 m)   Wt 198 lb 6 oz (90 kg)   SpO2 97%   BMI 25.47 kg/m  General: Awake, alert, appears stated age HEENT: AT, [place], ears patent b/l and TM's neg, nares patent w/o discharge, pharynx pink and without exudates, MMM Neck: No masses or asymmetry Heart: RRR, no murmurs, no bruits Lungs: CTAB, no accessory muscle use Abd: Soft, NT, ND, no masses or organomegaly Psych: Age appropriate judgment and insight, normal mood and affect  Gastroenteritis - Plan: ondansetron (ZOFRAN) 4 MG tablet  Orders as above. Continue to push fluids (gatorade), practice good hand hygiene, cover mouth when coughing. Culture stool if still having runny stools on Friday.  Rapid flu neg, does not clinically sound like influenza.  F/u prn. If starting to experience fevers, shaking, or shortness of breath, seek immediate care. Pt voiced understanding and agreement to the plan.  Jilda Rocheicholas Paul [place], DO 12/02/17 9:47 AM

## 2017-12-02 NOTE — Addendum Note (Signed)
Addended by: Scharlene GlossEWING, Margia Wiesen B on: 12/02/2017 10:40 AM   Modules accepted: Orders

## 2017-12-02 NOTE — Patient Instructions (Addendum)
Continue to push fluids, practice good hand hygiene, and cover your mouth if you cough.  If you start having fevers, shaking or shortness of breath, seek immediate care.  Your flu test is negative.  If you are still having diarrhea by Friday, let us know. Try to avoid antidiarrheals until then.

## 2017-12-14 ENCOUNTER — Encounter: Payer: Self-pay | Admitting: Medical

## 2017-12-14 ENCOUNTER — Ambulatory Visit: Payer: Federal, State, Local not specified - PPO | Admitting: Medical

## 2017-12-14 ENCOUNTER — Telehealth: Payer: Self-pay | Admitting: Medical

## 2017-12-14 ENCOUNTER — Ambulatory Visit (HOSPITAL_BASED_OUTPATIENT_CLINIC_OR_DEPARTMENT_OTHER)
Admission: RE | Admit: 2017-12-14 | Discharge: 2017-12-14 | Disposition: A | Discharge status: Home / Self Care | Payer: Federal, State, Local not specified - PPO | Source: Ambulatory Visit | Attending: Medical | Admitting: Medical

## 2017-12-14 VITALS — BP 112/76 | HR 65 | Temp 98.2°F | Resp 16 | Ht 72.0 in | Wt 198.2 lb

## 2017-12-14 DIAGNOSIS — R0781 Pleurodynia: Secondary | ICD-10-CM | POA: Diagnosis not present

## 2017-12-14 DIAGNOSIS — R079 Chest pain, unspecified: Secondary | ICD-10-CM | POA: Insufficient documentation

## 2017-12-14 DIAGNOSIS — Z801 Family history of malignant neoplasm of trachea, bronchus and lung: Secondary | ICD-10-CM

## 2017-12-14 DIAGNOSIS — F17200 Nicotine dependence, unspecified, uncomplicated: Secondary | ICD-10-CM | POA: Diagnosis not present

## 2017-12-14 DIAGNOSIS — Z122 Encounter for screening for malignant neoplasm of respiratory organs: Secondary | ICD-10-CM

## 2017-12-14 LAB — TROPONIN I: TNIDX: 0 ug/l (ref 0.00–0.06)

## 2017-12-14 MED ORDER — KETOROLAC TROMETHAMINE 60 MG/2ML IM SOLN
60.0000 mg | Freq: Once | INTRAMUSCULAR | Status: AC
  Administered 2017-12-14: 60 mg via INTRAMUSCULAR

## 2017-12-14 NOTE — Patient Instructions (Signed)
You appear to have direct tenderness to palpation over left rib region.  Some pain radiating to the left axilla and down towards lower ribs with direct palpation on point tender area.  We did a EKG today and it appears to be normal sinus rhythm.  Some possible inferior ST elevation but on close review and comparison to prior EKG in March this looks virtually the exact same.  Does not appear to be an acute finding.  Your pain appears to be more muscular or rib in origin.  But for caution sake will get one stat troponin.  If your pain worsens or changes then recommend be seen in the emergency department for further workup.  We gave you Toradol 60 mg IM in the office.  Some response to that injection.  I do want you to get over-the-counter lidocaine patch and use daily to that point tender area.  Also you can start some low-dose ibuprofen tomorrow late morning/early afternoon.  When you get the x-ray of your chest today, I want you to point to the direct area of pain.  They can marked that area so radiologist can concentrate in that region.  For your history of smoking, I will try to order screening CT based on the duration of smoking.  Follow-up in 7-10 days or as needed.

## 2017-12-14 NOTE — Telephone Encounter (Signed)
Screening ct chest/lung. Pt request. Approximate 30 yr smoking history. 47 year old.

## 2017-12-14 NOTE — Progress Notes (Signed)
Subjective:    Patient ID: Mark Duncan, male    DOB: Apr 16, 1971, 47 y.o.   MRN: [id number]  HPI  Pt in with pain in with left side rib area pain that radiates up to axillary area down to lower ribs. Pain on and off for months since august. No anterior chest pain. Pain on and off since august. Recently pain is worse over the weekend laying on his left side. Very severe tenderness to palpation point tender over left side rib. Also some pain on deep breathing when lying on his left side.  No jaw pain, no sweating or anterior chest pain.  Pt in past had some anterior region chest pain and negative work up with cardiologist years ago and those studies were negative. Some atypical pain in past when I saw him in past as well. In past had referred to cardiologist. Looks like pt did not go to that appointment.   Pt has smoked for since sophomore at highschool 15-17 yo(initially mentioned 26 yr hx but then states some earlier). At most pack a day in past.      Review of Systems  Constitutional: Negative for chills, fatigue and fever.  Respiratory: Positive for cough. Negative for choking, chest tightness, shortness of breath, wheezing and stridor.        Pt has been coughing some.  Cardiovascular: Negative for chest pain and palpitations.  Gastrointestinal: Negative for abdominal pain and blood in stool.  Musculoskeletal: Negative for back pain.  Skin: Negative for rash.  Psychiatric/Behavioral: Negative for behavioral problems, decreased concentration, dysphoric mood, sleep disturbance and suicidal ideas.     Past Medical History:  Diagnosis Date  . Blood transfusion without reported diagnosis   . GERD (gastroesophageal reflux disease)   . Hyperlipidemia      Social History   Socioeconomic History  . Marital status: Married    Spouse name: Not on file  . Number of children: Not on file  . Years of education: Not on file  . Highest education level: Not on file  Social  Needs  . Financial resource strain: Not on file  . Food insecurity - worry: Not on file  . Food insecurity - inability: Not on file  . Transportation needs - medical: Not on file  . Transportation needs - non-medical: Not on file  Occupational History  . Not on file  Tobacco Use  . Smoking status: Light Tobacco Smoker  . Smokeless tobacco: Former Engineer, waterUser  Substance and Sexual Activity  . Alcohol use: No    Alcohol/week: 0.0 oz  . Drug use: Not on file  . Sexual activity: Not on file  Other Topics Concern  . Not on file  Social History Narrative  . Not on file    Past Surgical History:  Procedure Laterality Date  . HAND SURGERY     Glass removed  . KNEE ARTHROSCOPY W/ MENISCAL REPAIR      Family History  Problem Relation Age of Onset  . Heart disease Maternal Uncle   . Heart disease Maternal Grandmother     Allergies  Allergen Reactions  . Fish Allergy Hives, Itching and Swelling    Grouper Only    Current Outpatient Medications on File Prior to Visit  Medication Sig Dispense Refill  . diclofenac (VOLTAREN) 75 MG EC tablet Take 1 tablet (75 mg total) by mouth 2 (two) times daily. 20 tablet 0  . naproxen (NAPROSYN) 500 MG tablet Take 500 mg by mouth 2 (two) times  daily with a meal.    . ondansetron (ZOFRAN) 4 MG tablet Take 1 tablet (4 mg total) by mouth every 8 (eight) hours as needed for nausea or vomiting. 20 tablet 0  . promethazine (PHENERGAN) 12.5 MG tablet Take 1 tablet (12.5 mg total) by mouth every 8 (eight) hours as needed for nausea or vomiting. 12 tablet 0  . ranitidine (ZANTAC) 150 MG capsule Take 1 capsule (150 mg total) by mouth 2 (two) times daily. 60 capsule 2  . [DISCONTINUED] buPROPion (WELLBUTRIN SR) 150 MG 12 hr tablet 1 tab po q day x 3 days then 1 tab po bid 63 tablet 1   No current facility-administered medications on file prior to visit.     BP 112/76   Pulse 65   Temp 98.2 F (36.8 C) (Oral)   Resp 16   Ht 6' (1.829 m)   Wt 198 lb 3.2 oz  (89.9 kg)   SpO2 99%   BMI 26.88 kg/m       Objective:   Physical Exam  General  Mental Status - Alert. General Appearance - Well groomed. Not in acute distress.  Skin Rashes- No Rashes. No vesicles.   Neck Neck- Supple. No Masses.   Chest and Lung Exam Auscultation: Breath Sounds:-Clear even and unlabored.  Cardiovascular Auscultation:Rythm- Regular, rate and rhythm. Murmurs & Other Heart Sounds:Ausculatation of the heart reveal- No Murmurs.  Lymphatic Head & Neck General Head & Neck Lymphatics: Bilateral: Description- No Localized lymphadenopathy.  Axillary area- no masses or lymph nodes felt.        Assessment & Plan:   You appear to have direct tenderness to palpation over left rib region.  Some pain radiating to the left axilla and down towards lower ribs with direct palpation on point tender area.  We did a EKG today and it appears to be normal sinus rhythm.  Some possible inferior ST elevation but on close review and comparison to prior EKG in March this looks virtually the exact same.  Does not appear to be an acute finding.  Your pain appears to be more muscular or rib in origin.  But for caution sake will get one stat troponin.  If your pain worsens or changes then recommend be seen in the emergency department for further workup.  We gave you Toradol 60 mg IM in the office.  Some response to that injection.  I do want you to get over-the-counter lidocaine patch and use daily to that point tender area.  Also you can start some low-dose ibuprofen tomorrow late morning/early afternoon.  When you get the x-ray of your chest today, I want you to point to the direct area of pain.  They can marked that area so radiologist can concentrate in that region.  For your history of smoking, I will try to order screening CT based on the duration of smoking.  Follow-up in 7-10 days or as needed.

## 2017-12-16 ENCOUNTER — Telehealth: Payer: Self-pay | Admitting: Medical

## 2017-12-16 ENCOUNTER — Ambulatory Visit (HOSPITAL_BASED_OUTPATIENT_CLINIC_OR_DEPARTMENT_OTHER): Admission: RE | Admit: 2017-12-16 | Payer: Federal, State, Local not specified - PPO | Source: Ambulatory Visit

## 2017-12-16 ENCOUNTER — Other Ambulatory Visit: Payer: Self-pay | Admitting: Medical

## 2017-12-16 ENCOUNTER — Ambulatory Visit (INDEPENDENT_AMBULATORY_CARE_PROVIDER_SITE_OTHER): Payer: Federal, State, Local not specified - PPO

## 2017-12-16 DIAGNOSIS — R918 Other nonspecific abnormal finding of lung field: Secondary | ICD-10-CM

## 2017-12-16 DIAGNOSIS — Z122 Encounter for screening for malignant neoplasm of respiratory organs: Secondary | ICD-10-CM

## 2017-12-16 DIAGNOSIS — S2232XA Fracture of one rib, left side, initial encounter for closed fracture: Secondary | ICD-10-CM | POA: Diagnosis not present

## 2017-12-16 MED ORDER — TRAMADOL HCL 50 MG PO TABS: 50.0000 mg | ORAL_TABLET | Freq: Three times a day (TID) | ORAL | 0 refills | Status: DC | PRN

## 2017-12-16 NOTE — Telephone Encounter (Signed)
Today I tried to print the prescription of tramadol but the printer was not working.  So would you look at that tomorrow and see if he comes off the printer.  If it does not would you reprint that so I can sign it.  Then discontinue the prescription I wrote yesterday.

## 2017-12-16 NOTE — Telephone Encounter (Signed)
Tramadol prescription printed.  Will you send that to his pharmacy.

## 2017-12-16 NOTE — Telephone Encounter (Signed)
Tramadol prescription printed.  We send that to his pharmacy.

## 2017-12-17 NOTE — Telephone Encounter (Signed)
Rx sent to pharmacy   

## 2018-01-01 ENCOUNTER — Ambulatory Visit: Payer: Self-pay | Admitting: *Deleted

## 2018-01-01 ENCOUNTER — Ambulatory Visit: Payer: Federal, State, Local not specified - PPO | Admitting: Family Medicine

## 2018-01-01 DIAGNOSIS — Z0289 Encounter for other administrative examinations: Secondary | ICD-10-CM

## 2018-01-01 NOTE — Telephone Encounter (Signed)
Called in with anxiety and having a hard time coping with everything. See below triage note for details.  I spent about 30 minutes with him just allowing him to talk.  He admitted to being able to open up with me more than he had anyone else except his wife.   I allowed him to talk as long as he needed.  By the end of the conversation he said felt and he sounded better than at the beginning of of our conversation.   I encouraged him to be open about trying any medications Dr. Alvira MondaySaguier might prescribe for him on Monday.   He said he didn't like taking medicine but he is open to trying whatever it takes to handle these anxiety attacks.   He did not indicate in his conversation that he was suicidal or wanted to hurt anyone.  He talked about how very supportive his wife is.  She also has problems with anxiety but has it under control with Ativan. By the end of our conversation he was looking forward to his Monday appt with Dr. Alvira MondaySaguier.    Reason for Disposition . [1] Symptoms of anxiety or panic AND [2] has not been evaluated for this by physician  Answer Assessment - Initial Assessment Questions 1. CONCERN: "What happened that made you call today?"     Since Tuesday this week I'm been going to work and I'm out and about today.   I don't want to go to work.  I have to make myself go to work.   I have opened up to my wife and she is the reason for this phone call.   My wife also has anxiety attacks.   She wants shut herself down when this happens.  She uses Ativan.   I'm been in and out of jobs this year.  I'm having similar experiences that she has. 2. ANXIETY SYMPTOM SCREENING: "Can you describe how you have been feeling?"  (e.g., tense, restless, panicky, anxious, keyed up, trouble sleeping, trouble concentrating)     I just feel like I'm shutting.  I feel overwhelmed.  I have to get back to my job.  My job is important.   I'm trying get a handle on this.   3. ONSET: "How long have you been feeling this  way?"     I lose sleep at night.    4. RECURRENT: "Have you felt this way before?"  If yes: "What happened that time?" "What helped these feelings go away in the past?"      Yes 5. RISK OF HARM - SUICIDAL IDEATION:  "Do you ever have thoughts of hurting or killing yourself?"  (e.g., yes, no, no but preoccupation with thoughts about death)   - INTENT:  "Do you have thoughts of hurting or killing yourself right NOW?" (e.g., yes, no, N/A)   - PLAN: "Do you have a specific plan for how you would do this?" (e.g., gun, knife, overdose, no plan, N/A)     No.  I just let him talk about his feelings.   No mention of wanting to hurt himself or others. 6. RISK OF HARM - HOMICIDAL IDEATION:  "Do you ever have thoughts of hurting or killing someone else?"  (e.g., yes, no, no but preoccupation with thoughts about death)   - INTENT:  "Do you have thoughts of hurting or killing someone right NOW?" (e.g., yes, no, N/A)   - PLAN: "Do you have a specific plan for how you would do  this?" (e.g., gun, knife, no plan, N/A)      See above 7. FUNCTIONAL IMPAIRMENT: "How have things been going for you overall in your life? Have you had any more difficulties than usual doing your normal daily activities?"  (e.g., better, same, worse; self-care, school, work, interactions)     I have a love hate relationship with work.  I can't turn my brain off.  Then in the mornings I'm tired.  The last 2 days have been bad.  It started since 2008.  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  I was making good money down to hardly making nothing.   8. SUPPORT: "Who is with you now?" "Who do you live with?" "Do you have family or friends nearby who you can talk to?"      My wife sees a Veterinary surgeon.  I don't.   9. THERAPIST: "Do you have a counselor or therapist? Name?"     No.   I rarely open up.  I have opened up with you more than I have with anyone today.   10. STRESSORS: "Has there been any new stress or recent changes in  your life?"       See above 11. CAFFEINE ABUSE: "Do you drink caffeinated beverages, and how much each day?" (e.g., coffee, tea, colas)       Not asked.  I just let him talk. 12. SUBSTANCE ABUSE: "Do you use any illegal drugs or alcohol?"       Not asked.    13. OTHER SYMPTOMS: "Do you have any other physical symptoms right now?" (e.g., chest pain, palpitations, difficulty breathing, fever)       No  14. PREGNANCY: "Is there any chance you are pregnant?" "When was your last menstrual period?"       N/A  Protocols used: ANXIETY AND PANIC ATTACK-A-AH

## 2018-01-01 NOTE — Telephone Encounter (Signed)
FYI

## 2018-01-04 ENCOUNTER — Encounter: Payer: Self-pay | Admitting: Medical

## 2018-01-04 ENCOUNTER — Ambulatory Visit: Payer: Federal, State, Local not specified - PPO | Admitting: Medical

## 2018-01-04 VITALS — BP 137/71 | HR 81 | Temp 98.2°F | Resp 16 | Ht 72.0 in | Wt 201.2 lb

## 2018-01-04 DIAGNOSIS — F329 Major depressive disorder, single episode, unspecified: Secondary | ICD-10-CM

## 2018-01-04 DIAGNOSIS — F419 Anxiety disorder, unspecified: Secondary | ICD-10-CM

## 2018-01-04 DIAGNOSIS — F32A Depression, unspecified: Secondary | ICD-10-CM

## 2018-01-04 MED ORDER — VENLAFAXINE HCL ER 37.5 MG PO CP24: 37.5000 mg | ORAL_CAPSULE | Freq: Every day | ORAL | 0 refills | Status: DC

## 2018-01-04 MED ORDER — LORAZEPAM 0.5 MG PO TABS: 0.5000 mg | ORAL_TABLET | Freq: Two times a day (BID) | ORAL | 0 refills | Status: DC | PRN
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
## 2018-01-04 NOTE — Patient Instructions (Addendum)
For anxiety and depression will rx effexor and ativan.   I want you to use both meds as directed. Try to use ativan sparingly if needed.  Can give you number of counselor as well if you don't do well with the above.  Follow up in 2 weeks or as needed  We gave information on counseling and recommend [facility]. Please call and initiate the process.

## 2018-01-06 ENCOUNTER — Encounter: Payer: Self-pay | Admitting: Medical

## 2018-01-08 ENCOUNTER — Telehealth: Payer: Self-pay | Admitting: Medical

## 2018-01-08 MED ORDER — TRAZODONE HCL 50 MG PO TABS: 25.0000 mg | ORAL_TABLET | Freq: Every evening | ORAL | 3 refills | Status: DC | PRN

## 2018-01-08 NOTE — Telephone Encounter (Signed)
Patient is currently on Ativan for his anxiety.  He should have stopped Effexor the other day as he thought that was causing him side effects.  The message was now he wants something to help him sleep.  We can try trazodone.  I will send that to the pharmacy.  Ask him if he has found out if his insurance covers counseling.  I think it is best for him to go ahead and schedule follow-up with me on Monday or Tuesday since medication management is not going as well as I had planned.  If he has severe anxiety, depression, or thoughts of harm to self or others over the weekend then advised [facility].  See if he is going to schedule with me on Monday morning.  I will go ahead and send in trazodone.

## 2018-01-08 NOTE — Telephone Encounter (Signed)
Notified pt of message below. Pt will try trazodone and follow up next week.

## 2018-01-08 NOTE — Telephone Encounter (Signed)
Copied from CRM [id number]. Topic: Quick Communication - See Telephone Encounter >> Jan 08, 2018  8:48 AM Diana EvesHoyt, Maryann B wrote: CRM for notification. See Telephone encounter for:  Pt was seen on Monday for anxiety and depression. He has been unable to sleep for the past 2 nights. He is needing something stronger to help him sleep at night. He's wanting something to "force it off". He would like to speak with Ramon DredgeEdward or his nurse.  01/08/18.

## 2018-01-11 ENCOUNTER — Ambulatory Visit: Payer: Federal, State, Local not specified - PPO | Admitting: Psychology

## 2018-01-18 ENCOUNTER — Ambulatory Visit: Payer: Federal, State, Local not specified - PPO | Admitting: Medical

## 2018-01-18 DIAGNOSIS — Z0289 Encounter for other administrative examinations: Secondary | ICD-10-CM

## 2018-01-19 ENCOUNTER — Encounter: Payer: Self-pay | Admitting: Medical

## 2018-01-26 ENCOUNTER — Telehealth: Payer: Self-pay | Admitting: Medical

## 2018-01-26 NOTE — Telephone Encounter (Signed)
Patient never followed up as I had asked him to.  Did he ever find out if he had counseling benefits.  Does he want to see a counselor?  Would you call patient and see how he is doing.

## 2018-01-27 NOTE — Telephone Encounter (Signed)
Pt has an appointment Monday at 8:15

## 2018-02-01 ENCOUNTER — Ambulatory Visit: Payer: Federal, State, Local not specified - PPO | Admitting: Medical

## 2018-02-05 ENCOUNTER — Encounter: Payer: Self-pay | Admitting: Medical

## 2018-02-05 ENCOUNTER — Ambulatory Visit: Payer: Federal, State, Local not specified - PPO | Admitting: Medical

## 2018-02-05 VITALS — BP 107/68 | HR 63 | Temp 98.0°F | Resp 16 | Ht 72.0 in | Wt 197.2 lb

## 2018-02-05 DIAGNOSIS — F329 Major depressive disorder, single episode, unspecified: Secondary | ICD-10-CM

## 2018-02-05 DIAGNOSIS — F419 Anxiety disorder, unspecified: Secondary | ICD-10-CM

## 2018-02-05 DIAGNOSIS — F32A Depression, unspecified: Secondary | ICD-10-CM

## 2018-02-05 MED ORDER — HYDROXYZINE HCL 10 MG PO TABS
ORAL_TABLET | ORAL | 0 refills | Status: DC
Start: 2018-02-05 — End: 2018-11-19

## 2018-02-05 NOTE — Patient Instructions (Signed)
I do think that your depression/mood has been improved since the last visit.  Also the anxiety and insomnia sound like they are better as well.  However I do think there is further room for improvement.  I do think you would benefit from trazodone 50 mg nightly.  I think this will help steady your mood and improve your sleep.  You had intolerance/side effects to Effexor and benzodiazepine so I will write those classes of medications.  However I do think that you might benefit from hydroxyzine for anxiety during the day and you could also possibly uses at night if you are not able to sleep despite using the trazodone.  If your mood worsens or changes dramatically please let me know.  Currently do not want to do counseling but still think this might be a good idea if you do not improve with the above.  Would recommend that you not use the CBD Gummies as they are not FDA approved.   Follow-up in 3-4 weeks or as needed.

## 2018-02-05 NOTE — Progress Notes (Signed)
Subjective:    Patient ID: Mark Duncan, male    DOB: March 15, 1971, 47 y.o.   MRN: [id number]  HPI  Pt in for follow up. He started Effexor and xanax. He states he thought he had side effects with both. I advised stop both and did give him trazodone. He states trazadone was helping him sleep. 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  He describes a shift starting late afternoon and sometimes standing early in the next morning.  Pt is still having some anxiety at times as well.But he thinks xanax made him excessivley drowsy. He states he could not take this at work since would fall asleep.  Pt mom sent him a couple of packages of cbd gummies. He only takes one gummie at a time. He states this does help him relax. He did sleep well.   Overall his mood is better overall. Not expressing depression. He has not taken trazadone regularly.  Pt did not investigate counseling services. Presently he is hesitant.   Review of Systems  Constitutional: Negative for chills, fatigue and fever.  Respiratory: Negative for cough, choking, chest tightness, shortness of breath and wheezing.   Cardiovascular: Negative for chest pain and palpitations.  Gastrointestinal: Negative for abdominal pain.  Musculoskeletal: Negative for back pain and myalgias.  Skin: Negative for rash.  Neurological: Negative for dizziness, weakness and light-headedness.  Hematological: Negative for adenopathy. Does not bruise/bleed easily.  Psychiatric/Behavioral: Positive for dysphoric mood and sleep disturbance. Negative for behavioral problems, decreased concentration and hallucinations. The patient is nervous/anxious.     Past Medical History:  Diagnosis Date  . Blood transfusion without reported diagnosis   . GERD (gastroesophageal reflux disease)   . Hyperlipidemia      Social History   Socioeconomic History  . Marital status: Married    Spouse name: Not on file  .  Number of children: Not on file  . Years of education: Not on file  . Highest education level: Not on file  Social Needs  . Financial resource strain: Not on file  . Food insecurity - worry: Not on file  . Food insecurity - inability: Not on file  . Transportation needs - medical: Not on file  . Transportation needs - non-medical: Not on file  Occupational History  . Not on file  Tobacco Use  . Smoking status: Light Tobacco Smoker  . Smokeless tobacco: Former Engineer, waterUser  Substance and Sexual Activity  . Alcohol use: No    Alcohol/week: 0.0 oz  . Drug use: Not on file  . Sexual activity: Not on file  Other Topics Concern  . Not on file  Social History Narrative  . Not on file    Past Surgical History:  Procedure Laterality Date  . HAND SURGERY     Glass removed  . KNEE ARTHROSCOPY W/ MENISCAL REPAIR      Family History  Problem Relation Age of Onset  . Heart disease Maternal Uncle   . Heart disease Maternal Grandmother     Allergies  Allergen Reactions  . Fish Allergy Hives, Itching and Swelling    Grouper Only    Current Outpatient Medications on File Prior to Visit  Medication Sig Dispense Refill  . diclofenac (VOLTAREN) 75 MG EC tablet Take 1 tablet (75 mg total) by mouth 2 (two) times daily. 20 tablet 0  . LORazepam (ATIVAN) 0.5 MG tablet Take 1 tablet (0.5 mg total) by mouth 2 (two)  times daily as needed for anxiety. 14 tablet 0  . ondansetron (ZOFRAN) 4 MG tablet Take 1 tablet (4 mg total) by mouth every 8 (eight) hours as needed for nausea or vomiting. 20 tablet 0  . promethazine (PHENERGAN) 12.5 MG tablet Take 1 tablet (12.5 mg total) by mouth every 8 (eight) hours as needed for nausea or vomiting. 12 tablet 0  . ranitidine (ZANTAC) 150 MG capsule Take 1 capsule (150 mg total) by mouth 2 (two) times daily. 60 capsule 2  . traZODone (DESYREL) 50 MG tablet Take 0.5-1 tablets (25-50 mg total) by mouth at bedtime as needed for sleep. 30 tablet 3  . venlafaxine XR  (EFFEXOR-XR) 37.5 MG 24 hr capsule Take 1 capsule (37.5 mg total) by mouth daily with breakfast. 30 capsule 0  . [DISCONTINUED] buPROPion (WELLBUTRIN SR) 150 MG 12 hr tablet 1 tab po q day x 3 days then 1 tab po bid 63 tablet 1   No current facility-administered medications on file prior to visit.     BP 107/68   Pulse 63   Temp 98 F (36.7 C) (Oral)   Resp 16   Ht 6' (1.829 m)   Wt 197 lb 3.2 oz (89.4 kg)   SpO2 99%   BMI 26.75 kg/m       Objective:   Physical Exam  General Mental Status- Alert. General Appearance- Not in acute distress.   Skin General: Color- Normal Color. Moisture- Normal Moisture.  Neck Carotid Arteries- Normal color. Moisture- Normal Moisture. No carotid bruits. No JVD.  Chest and Lung Exam Auscultation: Breath Sounds:-Normal.  Cardiovascular Auscultation:Rythm- Regular. Murmurs & Other Heart Sounds:Auscultation of the heart reveals- No Murmurs.  Abdomen Inspection:-Inspeection Normal. Palpation/Percussion:Note:No mass. Palpation and Percussion of the abdomen reveal- Non Tender, Non Distended + BS, no rebound or guarding.   Neurologic Cranial Nerve exam:- CN III-XII intact(No nystagmus), symmetric smile. Strength:- 5/5 equal and symmetric strength both upper and lower extremities.     Assessment & Plan:  I do think that your depression/mood has been improved since the last visit.  Also the anxiety and insomnia sound like they are better as well.  However I do think there is further room for improvement.  I do think you would benefit from trazodone 50 mg nightly.  I think this will help steady your mood and improve your sleep.  You had intolerance/side effects to Effexor and benzodiazepine so I will write those classes of medications.  However I do think that you might benefit from hydroxyzine for anxiety during the day and you could also possibly uses at night if you are not able to sleep despite using the trazodone.  If your mood worsens or  changes dramatically please let me know.  Currently do not want to do counseling but still think this might be a good idea if you do not improve with the above.  Would recommend that you not use the CBD Gummies as they are not FDA approved.   Follow-up in 3-4 weeks or as needed.  [name], PA-C

## 2018-03-24 DIAGNOSIS — R51 Headache: Secondary | ICD-10-CM | POA: Diagnosis not present

## 2018-03-24 DIAGNOSIS — M5413 Radiculopathy, cervicothoracic region: Secondary | ICD-10-CM | POA: Diagnosis not present

## 2018-03-24 DIAGNOSIS — M5412 Radiculopathy, cervical region: Secondary | ICD-10-CM | POA: Diagnosis not present

## 2018-03-24 DIAGNOSIS — M7912 Myalgia of auxiliary muscles, head and neck: Secondary | ICD-10-CM | POA: Diagnosis not present

## 2018-03-25 DIAGNOSIS — M5412 Radiculopathy, cervical region: Secondary | ICD-10-CM | POA: Diagnosis not present

## 2018-03-25 DIAGNOSIS — M5413 Radiculopathy, cervicothoracic region: Secondary | ICD-10-CM | POA: Diagnosis not present

## 2018-03-25 DIAGNOSIS — R51 Headache: Secondary | ICD-10-CM | POA: Diagnosis not present

## 2018-03-25 DIAGNOSIS — M7912 Myalgia of auxiliary muscles, head and neck: Secondary | ICD-10-CM | POA: Diagnosis not present

## 2018-04-09 DIAGNOSIS — R51 Headache: Secondary | ICD-10-CM | POA: Diagnosis not present

## 2018-04-09 DIAGNOSIS — M5412 Radiculopathy, cervical region: Secondary | ICD-10-CM | POA: Diagnosis not present

## 2018-04-09 DIAGNOSIS — M5413 Radiculopathy, cervicothoracic region: Secondary | ICD-10-CM | POA: Diagnosis not present

## 2018-04-09 DIAGNOSIS — M7912 Myalgia of auxiliary muscles, head and neck: Secondary | ICD-10-CM | POA: Diagnosis not present

## 2018-08-02 DIAGNOSIS — R51 Headache: Secondary | ICD-10-CM | POA: Diagnosis not present

## 2018-08-02 DIAGNOSIS — M7912 Myalgia of auxiliary muscles, head and neck: Secondary | ICD-10-CM | POA: Diagnosis not present

## 2018-08-02 DIAGNOSIS — M5413 Radiculopathy, cervicothoracic region: Secondary | ICD-10-CM | POA: Diagnosis not present

## 2018-08-02 DIAGNOSIS — M5412 Radiculopathy, cervical region: Secondary | ICD-10-CM | POA: Diagnosis not present

## 2018-08-04 DIAGNOSIS — M5412 Radiculopathy, cervical region: Secondary | ICD-10-CM | POA: Diagnosis not present

## 2018-08-04 DIAGNOSIS — M5413 Radiculopathy, cervicothoracic region: Secondary | ICD-10-CM | POA: Diagnosis not present

## 2018-08-04 DIAGNOSIS — R51 Headache: Secondary | ICD-10-CM | POA: Diagnosis not present

## 2018-08-04 DIAGNOSIS — M7912 Myalgia of auxiliary muscles, head and neck: Secondary | ICD-10-CM | POA: Diagnosis not present

## 2018-11-19 ENCOUNTER — Encounter: Payer: Self-pay | Admitting: Medical

## 2018-11-19 ENCOUNTER — Ambulatory Visit: Payer: Federal, State, Local not specified - PPO | Admitting: Medical

## 2018-11-19 VITALS — BP 119/59 | HR 83 | Temp 98.2°F | Resp 16 | Ht 72.0 in | Wt 210.2 lb

## 2018-11-19 DIAGNOSIS — J019 Acute sinusitis, unspecified: Secondary | ICD-10-CM | POA: Diagnosis not present

## 2018-11-19 MED ORDER — BENZONATATE 100 MG PO CAPS: 100.0000 mg | ORAL_CAPSULE | Freq: Three times a day (TID) | ORAL | 0 refills | Status: DC | PRN

## 2018-11-19 MED ORDER — DOXYCYCLINE HYCLATE 100 MG PO TABS: 100.0000 mg | ORAL_TABLET | Freq: Two times a day (BID) | ORAL | 0 refills | Status: DC

## 2018-11-19 MED ORDER — MONTELUKAST SODIUM 10 MG PO TABS: 10.0000 mg | ORAL_TABLET | Freq: Every day | ORAL | 3 refills | Status: DC

## 2018-11-19 MED ORDER — METHYLPREDNISOLONE ACETATE 40 MG/ML IJ SUSP
40.0000 mg | Freq: Once | INTRAMUSCULAR | Status: AC
  Administered 2018-11-19: 40 mg via INTRAMUSCULAR

## 2018-11-19 MED ORDER — LEVOCETIRIZINE DIHYDROCHLORIDE 5 MG PO TABS
5.0000 mg | ORAL_TABLET | Freq: Every evening | ORAL | 3 refills | Status: DC
Start: 2018-11-19 — End: 2019-01-18

## 2018-11-19 MED ORDER — FLUTICASONE PROPIONATE 50 MCG/ACT NA SUSP: 2.0000 | Freq: Every day | NASAL | 1 refills | Status: DC

## 2018-11-19 NOTE — Patient Instructions (Addendum)
Your appear to have a sinus infection. I am prescribing antibiotic doxycycline for the infection. To help with the nasal congestion I prescribed floase nasal steroid. For your associated cough, I prescribed cough medicine rx benzonatate.  After above cleared could continue flonase and rx xyzal.(also rx montelukast)  Rest, hydrate, tylenol for fever.  Follow up in 7 days or as needed.

## 2018-11-19 NOTE — Progress Notes (Signed)
Subjective:    Patient ID: Mark Duncan, male    DOB: 1971/04/12, 47 y.o.   MRN: [id number]  HPI  Pt in for nasal congestion and facial pressure. '  States since mid October states had runny and stuffy nose. He has been using claritin D at first and controlled symptoms initially. But over past week states sinus got painful. This week a lot of colored drainage and left side sinus very tender.   Rare occasional cough.  Pt tried netty pot but not much flow of water due to congestion.     Review of Systems  Constitutional: Negative for chills, fatigue and fever.  HENT: Positive for congestion, postnasal drip, rhinorrhea, sinus pressure and sinus pain. Negative for sneezing.        Left ear pressure.  Respiratory: Positive for cough. Negative for chest tightness, shortness of breath and wheezing.   Cardiovascular: Negative for chest pain and palpitations.  Gastrointestinal: Negative for abdominal pain.  Musculoskeletal: Negative for back pain.  Neurological: Negative for dizziness and headaches.  Hematological: Negative for adenopathy. Does not bruise/bleed easily.  Psychiatric/Behavioral: Negative for behavioral problems and confusion.       Updates me mood has been controlled and has steady job.   Past Medical History:  Diagnosis Date  . Blood transfusion without reported diagnosis   . GERD (gastroesophageal reflux disease)   . Hyperlipidemia      Social History   Socioeconomic History  . Marital status: Married    Spouse name: Not on file  . Number of children: Not on file  . Years of education: Not on file  . Highest education level: Not on file  Occupational History  . Not on file  Social Needs  . Financial resource strain: Not on file  . Food insecurity:    Worry: Not on file    Inability: Not on file  . Transportation needs:    Medical: Not on file    Non-medical: Not on file  Tobacco Use  . Smoking status: Light Tobacco Smoker  . Smokeless tobacco:  Former Engineer, water and Sexual Activity  . Alcohol use: No    Alcohol/week: 0.0 standard drinks  . Drug use: Not on file  . Sexual activity: Not on file  Lifestyle  . Physical activity:    Days per week: Not on file    Minutes per session: Not on file  . Stress: Not on file  Relationships  . Social connections:    Talks on phone: Not on file    Gets together: Not on file    Attends religious service: Not on file    Active member of club or organization: Not on file    Attends meetings of clubs or organizations: Not on file    Relationship status: Not on file  . Intimate partner violence:    Fear of current or ex partner: Not on file    Emotionally abused: Not on file    Physically abused: Not on file    Forced sexual activity: Not on file  Other Topics Concern  . Not on file  Social History Narrative  . Not on file    Past Surgical History:  Procedure Laterality Date  . HAND SURGERY     Glass removed  . KNEE ARTHROSCOPY W/ MENISCAL REPAIR      Family History  Problem Relation Age of Onset  . Heart disease Maternal Uncle   . Heart disease Maternal Grandmother  Allergies  Allergen Reactions  . Fish Allergy Hives, Itching and Swelling    Grouper Only    Current Outpatient Medications on File Prior to Visit  Medication Sig Dispense Refill  . ranitidine (ZANTAC) 150 MG capsule Take 1 capsule (150 mg total) by mouth 2 (two) times daily. 60 capsule 2  . [DISCONTINUED] buPROPion (WELLBUTRIN SR) 150 MG 12 hr tablet 1 tab po q day x 3 days then 1 tab po bid 63 tablet 1   No current facility-administered medications on file prior to visit.     BP (!) 119/59   Pulse 83   Temp 98.2 F (36.8 C) (Oral)   Resp 16   Ht 6' (1.829 m)   Wt 210 lb 3.2 oz (95.3 kg)   SpO2 99%   BMI 28.51 kg/m       Objective:   Physical Exam   General  Mental Status - Alert. General Appearance - Well groomed. Not in acute distress.  Skin Rashes- No  Rashes.  HEENT Head- Normal. Ear Auditory Canal - Left- Normal. Right - Normal.Tympanic Membrane- Left- Normal. Right- Normal. Eye Sclera/Conjunctiva- Left- Normal. Right- Normal. Nose & Sinuses Nasal Mucosa- Left-  Boggy and Congested. Right-  Boggy and  Congested.Bilateral maxillary and frontal sinus pressure.(worse on left side) Mouth & Throat Lips: Upper Lip- Normal: no dryness, cracking, pallor, cyanosis, or vesicular eruption. Lower Lip-Normal: no dryness, cracking, pallor, cyanosis or vesicular eruption. Buccal Mucosa- Bilateral- No Aphthous ulcers. Oropharynx- No Discharge or Erythema. Tonsils: Characteristics- Bilateral- No Erythema or Congestion. Size/Enlargement- Bilateral- No enlargement. Discharge- bilateral-None.  Neck Neck- Supple. No Masses.   Chest and Lung Exam Auscultation: Breath Sounds:-Clear even and unlabored.  Cardiovascular Auscultation:Rythm- Regular, rate and rhythm. Murmurs & Other Heart Sounds:Ausculatation of the heart reveal- No Murmurs.  Lymphatic Head & Neck General Head & Neck Lymphatics: Bilateral: Description- No Localized lymphadenopathy.      Assessment & Plan:  Your appear to have a sinus infection. I am prescribing antibiotic doxycycline for the infection. To help with the nasal congestion I prescribed floase nasal steroid. For your associated cough, I prescribed cough medicine rx benzonatate.  After above cleared could continue flonase and rx xyzal. (also rx montelukast)  Rest, hydrate, tylenol for fever.  Follow up in 7 days or as needed.  Also please stop aftrin. Using whole bottle may have lead to rebound nasal congestion. Will give depomedrol in addition to the above.  Esperanza RichtersEdward Joliyah Lippens, PA-C

## 2019-01-12 ENCOUNTER — Ambulatory Visit: Payer: Self-pay | Admitting: *Deleted

## 2019-01-12 NOTE — Telephone Encounter (Signed)
Message from Tonita Phoenix sent at 01/12/2019 3:57 PM EST   Summary: Rx Change   Pt's wife stated he has been taking levocetirizine (XYZAL) 5 MG tablet and montelukast (SINGULAIR) 10 MG tablet but they are not working for his allergies. Pt would like to request stronger dosage or different medication. Please advise and reach out to pt. [phone]

## 2019-01-18 ENCOUNTER — Ambulatory Visit: Payer: Self-pay | Admitting: Medical

## 2019-01-18 ENCOUNTER — Ambulatory Visit: Payer: Federal, State, Local not specified - PPO | Admitting: Family Medicine

## 2019-01-18 ENCOUNTER — Encounter: Payer: Self-pay | Admitting: Family Medicine

## 2019-01-18 ENCOUNTER — Encounter: Payer: Self-pay | Admitting: Medical

## 2019-01-18 VITALS — BP 116/76 | HR 78 | Temp 98.2°F | Resp 12 | Ht 72.0 in | Wt 208.4 lb

## 2019-01-18 DIAGNOSIS — R197 Diarrhea, unspecified: Secondary | ICD-10-CM | POA: Diagnosis not present

## 2019-01-18 DIAGNOSIS — K219 Gastro-esophageal reflux disease without esophagitis: Secondary | ICD-10-CM

## 2019-01-18 DIAGNOSIS — J309 Allergic rhinitis, unspecified: Secondary | ICD-10-CM

## 2019-01-18 DIAGNOSIS — R110 Nausea: Secondary | ICD-10-CM

## 2019-01-18 DIAGNOSIS — H6982 Other specified disorders of Eustachian tube, left ear: Secondary | ICD-10-CM

## 2019-01-18 LAB — POCT INFLUENZA A/B
Influenza A, POC: NEGATIVE
Influenza B, POC: NEGATIVE

## 2019-01-18 MED ORDER — ESOMEPRAZOLE MAGNESIUM 40 MG PO CPDR: 40.0000 mg | DELAYED_RELEASE_CAPSULE | Freq: Every day | ORAL | 1 refills | Status: DC

## 2019-01-18 MED ORDER — MOMETASONE FUROATE 50 MCG/ACT NA SUSP: 2.0000 | Freq: Every day | NASAL | 1 refills | Status: DC

## 2019-01-18 MED ORDER — ONDANSETRON HCL 4 MG PO TABS: 4.0000 mg | ORAL_TABLET | Freq: Three times a day (TID) | ORAL | 0 refills | Status: AC | PRN

## 2019-01-18 NOTE — Patient Instructions (Addendum)
  [name] I have seen you today for an acute visit.  A few things to remember from today's visit:   Gastroesophageal reflux disease, esophagitis presence not specified - Plan: esomeprazole (NEXIUM) 40 MG capsule  Nausea without vomiting - Plan: ondansetron (ZOFRAN) 4 MG tablet  Allergic rhinitis, unspecified seasonality, unspecified trigger - Plan: mometasone (NASONEX) 50 MCG/ACT nasal spray  Dysfunction of left eustachian tube  Diarrhea, unspecified type   Stop Nasonex, omeprazole, and Xyzal. Nasal irrigation with saline several times per day recommended for nasal congestion and runny nose. Nexium 40 mg 30 minutes before breakfast. Zyrtec 10 mg in the morning.  Adequate hydration, you can combine Gatorade and water and drink small sips frequently during the day. Monitor for new symptoms.  Follow-up with your PCP in 4 to 6 weeks.   GERD:  Avoid foods that make your symptoms worse, for example coffee, chocolate,pepermeint,alcohol, and greasy food. Raising the head of your bed about 6 inches may help with nocturnal symptoms.  Avoid tobacco use. Weight loss (if you are overweight). Avoid lying down for 3 hours after eating.  Instead 3 large meals daily try small and more frequent meals during the day.  Every medication have side effects and medications for GERD are not the exception.At this time I think benefit is greater than risk.    You should be evaluated immediately if bloody vomiting, bloody stools, black stools (like tar), difficulty swallowing, food gets stuck on the way down or choking when eating. Abnormal weight loss or severe abdominal pain.  If symptoms are not resolved sometimes endoscopy is necessary.    In general please monitor for signs of worsening symptoms and seek immediate medical attention if any concerning.  I hope you get better soon!

## 2019-01-18 NOTE — Progress Notes (Signed)
ACUTE VISIT   HPI:  Chief Complaint  Patient presents with  . Nasal Congestion    sx started last night  . Pressure in left ear  . Pressure in head    Mark Duncan is a 48 y.o. male, who is here today complaining of left ear discomfort, he also would like to address all other chronic problems. He states that he has had intermittent "blockage" of left ear, it seems to be worse since 09/2018. Negative for earache, ear drainage, and no Hx of trauma. Not sure about exacerbating or alleviating factors.  Last night he started with nausea, he has not had vomiting. For the past couple days he has had intermittent abdominal cramps and sharp pain, mild. Sometimes exacerbated by food intake. Today he started with diarrhea, he has had 2 stools. He has not noted blood in the stool but has had it before.  No sick contact. No overseas travel. No history of recent antibiotic treatment.  GERD: He is currently on omeprazole 20 mg daily, which is not helping with symptoms. Heartburn and acid reflux worse at night when he is in bed, interfering with his sleep. In the past he was on ranitidine 150 mg, which was helping.  + Nausea, he is not sure if this is related to an acute GI problem or to GERD.  Nasal congestion: History of allergy rhinitis, currently he is on Singulair 10 mg and Xyzal 5 mg daily. Since 09/2018 he has had persistent nasal congestion and rhinorrhea, medication helped initially but now he is starts having symptoms a few hours after taking medication. "Small cough", nonproductive. Negative for dyspnea or wheezing.  He also uses Flonase nasal spray, which does not help. Negative for visual changes, facial pain, or epistaxis. + Frontal pressure headache, intermittently, worse in the morning.  He has not identified exacerbating or alleviating factors.  Review of Systems  Constitutional: Positive for chills and fatigue. Negative for activity change, appetite  change and fever.  HENT: Positive for congestion, postnasal drip and rhinorrhea. Negative for ear discharge, ear pain, mouth sores, sneezing, sore throat, trouble swallowing and voice change.   Eyes: Negative for discharge, redness and itching.  Respiratory: Positive for cough. Negative for shortness of breath and wheezing.   Cardiovascular: Negative for chest pain.  Gastrointestinal: Positive for abdominal pain, diarrhea and nausea. Negative for blood in stool and vomiting.  Musculoskeletal: Positive for myalgias. Negative for joint swelling and neck pain.  Skin: Negative for rash.  Allergic/Immunologic: Positive for environmental allergies.  Neurological: Positive for headaches (Pressure). Negative for weakness.  Hematological: Negative for adenopathy. Does not bruise/bleed easily.  Psychiatric/Behavioral: Negative for confusion. The patient is nervous/anxious.       Current Outpatient Medications on File Prior to Visit  Medication Sig Dispense Refill  . montelukast (SINGULAIR) 10 MG tablet Take 1 tablet (10 mg total) by mouth at bedtime. 30 tablet 3  . benzonatate (TESSALON) 100 MG capsule Take 1 capsule (100 mg total) by mouth 3 (three) times daily as needed for cough. (Patient not taking: Reported on 01/18/2019) 30 capsule 0  . [DISCONTINUED] buPROPion (WELLBUTRIN SR) 150 MG 12 hr tablet 1 tab po q day x 3 days then 1 tab po bid 63 tablet 1   No current facility-administered medications on file prior to visit.      Past Medical History:  Diagnosis Date  . Blood transfusion without reported diagnosis   . GERD (gastroesophageal reflux disease)   .  Hyperlipidemia    Allergies  Allergen Reactions  . Fish Allergy Hives, Itching and Swelling    Grouper Only    Social History   Socioeconomic History  . Marital status: Married    Spouse name: Not on file  . Number of children: Not on file  . Years of education: Not on file  . Highest education level: Not on file    Occupational History  . Not on file  Social Needs  . Financial resource strain: Not on file  . Food insecurity:    Worry: Not on file    Inability: Not on file  . Transportation needs:    Medical: Not on file    Non-medical: Not on file  Tobacco Use  . Smoking status: Light Tobacco Smoker  . Smokeless tobacco: Former Engineer, water and Sexual Activity  . Alcohol use: No    Alcohol/week: 0.0 standard drinks  . Drug use: Not on file  . Sexual activity: Not on file  Lifestyle  . Physical activity:    Days per week: Not on file    Minutes per session: Not on file  . Stress: Not on file  Relationships  . Social connections:    Talks on phone: Not on file    Gets together: Not on file    Attends religious service: Not on file    Active member of club or organization: Not on file    Attends meetings of clubs or organizations: Not on file    Relationship status: Not on file  Other Topics Concern  . Not on file  Social History Narrative  . Not on file    Vitals:   01/18/19 1128  BP: 116/76  Pulse: 78  Resp: 12  Temp: 98.2 F (36.8 C)  SpO2: 96%   Body mass index is 28.26 kg/m.   Physical Exam  Nursing note and vitals reviewed. Constitutional: He is oriented to person, place, and time. He appears well-developed and well-nourished. He does not appear ill. No distress.  HENT:  Head: Normocephalic and atraumatic.  Right Ear: Tympanic membrane, external ear and ear canal normal.  Left Ear: Tympanic membrane, external ear and ear canal normal.  Nose: Rhinorrhea present. Right sinus exhibits no maxillary sinus tenderness and no frontal sinus tenderness. Left sinus exhibits no maxillary sinus tenderness and no frontal sinus tenderness.  Mouth/Throat: Oropharynx is clear and moist and mucous membranes are normal.  Hypertrophic turbinates.  Eyes: Conjunctivae and EOM are normal.  Cardiovascular: Normal rate and regular rhythm.  No murmur heard. Respiratory: Effort  normal and breath sounds normal. No respiratory distress.  GI: Soft. Bowel sounds are normal. He exhibits no mass. There is no hepatomegaly. There is abdominal tenderness in the epigastric area. There is no rigidity, no rebound and no guarding.  Lymphadenopathy:    He has no cervical adenopathy.  Neurological: He is alert and oriented to person, place, and time. He has normal strength. No cranial nerve deficit.  Skin: Skin is warm. No rash noted. No erythema.  Psychiatric: His mood appears anxious.  Well groomed, good eye contact.    ASSESSMENT AND PLAN:  Mr. Dalyn was seen today for nasal congestion, pressure in left ear and pressure in head + other concerns.   Diagnoses and all orders for this visit:  Dysfunction of left eustachian tube Gross hearing otherwise normal. Auto inflation maneuvers and/or OTC decongestant may help. He may need ENT evaluation if problem is persistent.  Gastroesophageal reflux disease, esophagitis  presence not specified Omeprazole 20 mg is not helping, he prefers to try something different, so discontinued. We discussed other pharmacologic options, he agrees with Nexium 40 mg daily. GERD precautions also recommended. Follow-up with PCP in 4 to 6 weeks.  -     esomeprazole (NEXIUM) 40 MG capsule; Take 1 capsule (40 mg total) by mouth daily.  Nausea without vomiting This could be related to GERD or to acute gastroenteritis. Adequate hydration. Symptomatic treatment with Zofran.  -     ondansetron (ZOFRAN) 4 MG tablet; Take 1 tablet (4 mg total) by mouth every 8 (eight) hours as needed for up to 4 days for nausea or vomiting.  Allergic rhinitis, unspecified seasonality, unspecified trigger Flonase is not helping, so recommend Nasonex intranasal spray. Xyzal discontinued, recommend starting Zyrtec 10 mg daily in the morning. No changes in Singulair 10 mg. Follow-up with PCP in 4 to 6 weeks.  -     mometasone (NASONEX) 50 MCG/ACT nasal spray; Place 2  sprays into the nose daily.  Diarrhea, unspecified type We discussed possible etiologies, most likely viral.  Rapid flu here in the office negative.  Oral hydration with clear fluids and bland diet. Monitor for new symptoms. Good hand hygiene. Instructed about warning signs. Follow-up with PCP if needed.    Return in about 5 weeks (around 02/22/2019) for PCP.      Betty G. [place], MD  [facility]. [place] office.

## 2019-01-28 ENCOUNTER — Ambulatory Visit (INDEPENDENT_AMBULATORY_CARE_PROVIDER_SITE_OTHER): Payer: Federal, State, Local not specified - PPO | Admitting: Medical

## 2019-01-28 ENCOUNTER — Encounter: Payer: Self-pay | Admitting: Medical

## 2019-01-28 VITALS — BP 125/74 | HR 72 | Temp 98.2°F | Resp 16 | Ht 72.0 in | Wt 208.0 lb

## 2019-01-28 DIAGNOSIS — K219 Gastro-esophageal reflux disease without esophagitis: Secondary | ICD-10-CM

## 2019-01-28 DIAGNOSIS — J309 Allergic rhinitis, unspecified: Secondary | ICD-10-CM | POA: Diagnosis not present

## 2019-01-28 MED ORDER — METHYLPREDNISOLONE ACETATE 40 MG/ML IJ SUSP
40.0000 mg | Freq: Once | INTRAMUSCULAR | Status: AC
  Administered 2019-01-28: 40 mg via INTRAMUSCULAR

## 2019-01-28 MED ORDER — PREDNISONE 10 MG PO TABS: ORAL_TABLET | ORAL | 0 refills | Status: DC

## 2019-01-28 MED ORDER — FAMOTIDINE 20 MG PO TABS: ORAL_TABLET | ORAL | 0 refills | Status: DC

## 2019-01-28 NOTE — Patient Instructions (Addendum)
You do describe recent allergic rhinitis flare and pet dander does appear to be suspicious cause.  You having symptoms despite use of Zyrtec, Nasonex and Singulair.  We gave you Depo-Medrol 40 mg IM injection today.  I want to see if he have some relief by tomorrow.  If not then you could also start 4-day taper dose of prednisone tomorrow.  That prescription was sent to your pharmacy.  You have upcoming appointment with allergist on March 4 and sounds like they will do allergy testing then.  For reflux I sent in famotidine x.  Follow-up with me as needed after that appointment.  By your recent history and physical exam I do not think you have any associated sinus infection.  If your symptoms were to worsen indicating an sinus infection please let me.

## 2019-01-28 NOTE — Progress Notes (Signed)
Subjective:    Patient ID: Mark Duncan, male    DOB: [birth year], 48 y.o.   MRN: [id number]  HPI Pt in for allergic rhinitis symptoms. He has been having nasal congestion, sneezing and feels pnd.  He states he gets some short term relief with nasonex, zyrtec and singulair(changes made to regimen on 01/18/2019 really did not help much). Pt notes when at home symptoms worse. He thinks he has symptoms from pet dander(he has 3 dogs that shed a lot). Symptoms will be obvious in house. When he leaves house and gets in truck symptoms improve.  Also he has reflux and he did well with zantac but since recall other office switched him to nexium. He thinks nexium causes him nausea. He wants to try something different.   Review of Systems  Constitutional: Negative for chills, fatigue and fever.  HENT: Positive for congestion, postnasal drip, sinus pressure and sneezing. Negative for nosebleeds, rhinorrhea and sinus pain.   Respiratory: Negative for cough, chest tightness, shortness of breath and wheezing.   Gastrointestinal: Negative for abdominal pain.       Reflux in past controlled well with zantac/h2 blocker.  Musculoskeletal: Negative for back pain.  Neurological: Negative for dizziness and headaches.  Hematological: Negative for adenopathy. Does not bruise/bleed easily.  Psychiatric/Behavioral: Negative for behavioral problems and confusion.    Past Medical History:  Diagnosis Date  . Blood transfusion without reported diagnosis   . GERD (gastroesophageal reflux disease)   . Hyperlipidemia      Social History   Socioeconomic History  . Marital status: Married    Spouse name: Not on file  . Number of children: Not on file  . Years of education: Not on file  . Highest education level: Not on file  Occupational History  . Not on file  Social Needs  . Financial resource strain: Not on file  . Food insecurity:    Worry: Not on file    Inability: Not on file  . Transportation  needs:    Medical: Not on file    Non-medical: Not on file  Tobacco Use  . Smoking status: Light Tobacco Smoker  . Smokeless tobacco: Former Engineer, water and Sexual Activity  . Alcohol use: No    Alcohol/week: 0.0 standard drinks  . Drug use: Not on file  . Sexual activity: Not on file  Lifestyle  . Physical activity:    Days per week: Not on file    Minutes per session: Not on file  . Stress: Not on file  Relationships  . Social connections:    Talks on phone: Not on file    Gets together: Not on file    Attends religious service: Not on file    Active member of club or organization: Not on file    Attends meetings of clubs or organizations: Not on file    Relationship status: Not on file  . Intimate partner violence:    Fear of current or ex partner: Not on file    Emotionally abused: Not on file    Physically abused: Not on file    Forced sexual activity: Not on file  Other Topics Concern  . Not on file  Social History Narrative  . Not on file    Past Surgical History:  Procedure Laterality Date  . HAND SURGERY     Glass removed  . KNEE ARTHROSCOPY W/ MENISCAL REPAIR      Family History  Problem Relation Age of  Onset  . Heart disease Maternal Uncle   . Heart disease Maternal Grandmother     Allergies  Allergen Reactions  . Fish Allergy Hives, Itching and Swelling    Grouper Only    Current Outpatient Medications on File Prior to Visit  Medication Sig Dispense Refill  . esomeprazole (NEXIUM) 40 MG capsule Take 1 capsule (40 mg total) by mouth daily. 30 capsule 1  . mometasone (NASONEX) 50 MCG/ACT nasal spray Place 2 sprays into the nose daily. 17 g 1  . montelukast (SINGULAIR) 10 MG tablet Take 1 tablet (10 mg total) by mouth at bedtime. 30 tablet 3  . [DISCONTINUED] buPROPion (WELLBUTRIN SR) 150 MG 12 hr tablet 1 tab po q day x 3 days then 1 tab po bid 63 tablet 1   No current facility-administered medications on file prior to visit.     BP 125/74    Pulse 72   Temp 98.2 F (36.8 C) (Oral)   Resp 16   Ht 6' (1.829 m)   Wt 208 lb (94.3 kg)   SpO2 98%   BMI 28.21 kg/m       Objective:   Physical Exam  General  Mental Status - Alert. General Appearance - Well groomed. Not in acute distress.  Skin Rashes- No Rashes.  HEENT Head- Normal. Ear Auditory Canal - Left- Normal. Right - Normal.Tympanic Membrane- Left- Normal. Right- Normal. Eye Sclera/Conjunctiva- Left- Normal. Right- Normal. Nose & Sinuses Nasal Mucosa- Left-  Boggy and Congested. Right-  Boggy and  Congested.Bilateral no maxillary and no  frontal sinus pressure. Mouth & Throat Lips: Upper Lip- Normal: no dryness, cracking, pallor, cyanosis, or vesicular eruption. Lower Lip-Normal: no dryness, cracking, pallor, cyanosis or vesicular eruption. Buccal Mucosa- Bilateral- No Aphthous ulcers. Oropharynx- No Discharge or Erythema. +ond. Tonsils: Characteristics- Bilateral- No Erythema or Congestion. Size/Enlargement- Bilateral- No enlargement. Discharge- bilateral-None.  Neck Neck- Supple. No Masses.   Chest and Lung Exam Auscultation: Breath Sounds:-Clear even and unlabored.  Cardiovascular Auscultation:Rythm- Regular, rate and rhythm. Murmurs & Other Heart Sounds:Ausculatation of the heart reveal- No Murmurs.  Lymphatic Head & Neck General Head & Neck Lymphatics: Bilateral: Description- No Localized lymphadenopathy.       Assessment & Plan:  You do describe recent allergic rhinitis flare and pet dander does appear to be suspicious cause.  You having symptoms despite use of Zyrtec, Nasonex and Singulair.  We gave you Depo-Medrol 40 mg IM injection today.  I want to see if he have some relief by tomorrow.  If not then you could also start 4-day taper dose of prednisone tomorrow.  That prescription was sent to your pharmacy.  You have upcoming appointment with allergist on March 4 and sounds like they will do allergy testing then.  For reflux sent in  famotidine rx.  Follow-up with me as needed after that appointment.  By your recent history and physical exam I do not think you have any associated sinus infection.  If your symptoms were to worsen indicating an sinus infection please let me.

## 2019-02-09 DIAGNOSIS — J310 Chronic rhinitis: Secondary | ICD-10-CM | POA: Diagnosis not present

## 2019-02-09 DIAGNOSIS — J301 Allergic rhinitis due to pollen: Secondary | ICD-10-CM | POA: Diagnosis not present

## 2019-02-09 DIAGNOSIS — J3089 Other allergic rhinitis: Secondary | ICD-10-CM | POA: Diagnosis not present

## 2019-02-14 ENCOUNTER — Ambulatory Visit: Payer: Self-pay | Admitting: Allergy

## 2019-02-22 ENCOUNTER — Telehealth: Payer: Self-pay | Admitting: Medical

## 2019-02-22 NOTE — Telephone Encounter (Signed)
Copied from CRM [id number]. Topic: General - Other >> Feb 22, 2019  6:19 PM Aretta Nip wrote: Pt called in and wants to know if Ramon Dredge or Leavy Cella can call him in a Z-pak so he will not have to come in. Ramon Dredge saw his son Oziah Bobst a week or so ago and Moise Boring is having the same symptoms. His wife Judeth Cornfield saw Dr Rogelia Rohrer toward for the same sore throat , no fever symptoms. They do not have any concerns that they have been exposed to COVID but just want to ward off this without contaminating or the exposure to COVID. If so could you call in to HT... he will check with them later tomorrow.  Karin Golden at [facility] [address] [place] - [address] [phone] (Phone) [phone] (Fax)

## 2019-02-23 MED ORDER — AZITHROMYCIN 250 MG PO TABS: ORAL_TABLET | ORAL | 0 refills | Status: DC

## 2019-02-23 NOTE — Telephone Encounter (Signed)
Rx azithromycin sent to pt pharmacy since he had 2 close contacts with illness. Will need to see him if he does not improve.

## 2019-03-29 ENCOUNTER — Telehealth: Payer: Self-pay | Admitting: Medical

## 2019-03-29 NOTE — Telephone Encounter (Signed)
Patient called in concerning previous No show or canceled appointments and if they could be removed or forgiven.. I explained to patient that  He's still showing as an active patient and that there hasn't been a dismissal his account and to feel free to make an appointment at his convince.

## 2019-04-10 ENCOUNTER — Emergency Department
Admission: EM | Admit: 2019-04-10 | Discharge: 2019-04-10 | Disposition: A | Discharge status: Home / Self Care | Payer: Federal, State, Local not specified - PPO | Attending: Student in an Organized Health Care Education/Training Program | Admitting: Student in an Organized Health Care Education/Training Program

## 2019-04-10 ENCOUNTER — Emergency Department: Payer: Federal, State, Local not specified - PPO

## 2019-04-10 ENCOUNTER — Other Ambulatory Visit: Payer: Self-pay

## 2019-04-10 DIAGNOSIS — F17210 Nicotine dependence, cigarettes, uncomplicated: Secondary | ICD-10-CM | POA: Insufficient documentation

## 2019-04-10 DIAGNOSIS — Z79899 Other long term (current) drug therapy: Secondary | ICD-10-CM | POA: Diagnosis not present

## 2019-04-10 DIAGNOSIS — R0789 Other chest pain: Secondary | ICD-10-CM

## 2019-04-10 DIAGNOSIS — Z20828 Contact with and (suspected) exposure to other viral communicable diseases: Secondary | ICD-10-CM | POA: Diagnosis not present

## 2019-04-10 DIAGNOSIS — R079 Chest pain, unspecified: Secondary | ICD-10-CM | POA: Diagnosis not present

## 2019-04-10 LAB — COMPREHENSIVE METABOLIC PANEL
ALT: 20 U/L (ref 0–44)
AST: 18 U/L (ref 15–41)
Albumin: 4.3 g/dL (ref 3.5–5.0)
Alkaline Phosphatase: 68 U/L (ref 38–126)
Anion gap: 8 (ref 5–15)
BUN: 13 mg/dL (ref 6–20)
CO2: 25 mmol/L (ref 22–32)
Calcium: 8.9 mg/dL (ref 8.9–10.3)
Chloride: 108 mmol/L (ref 98–111)
Creatinine, Ser: 0.89 mg/dL (ref 0.61–1.24)
GFR calc Af Amer: >60 mL/min (ref >60)
GFR calc non Af Amer: >60 mL/min (ref >60)
Glucose, Bld: 102 mg/dL — ABNORMAL HIGH (ref 70–99)
Potassium: 3.8 mmol/L (ref 3.5–5.1)
Sodium: 141 mmol/L (ref 135–145)
Total Bilirubin: 0.6 mg/dL (ref 0.3–1.2)
Total Protein: 7.2 g/dL (ref 6.5–8.1)

## 2019-04-10 LAB — CBC WITH DIFFERENTIAL/PLATELET
Abs Immature Granulocytes: 0.04 10*3/uL (ref 0.00–0.07)
Basophils Absolute: 0.1 10*3/uL (ref 0.0–0.1)
Basophils Relative: 1 %
Eosinophils Absolute: 0.5 10*3/uL (ref 0.0–0.5)
Eosinophils Relative: 4 %
HCT: 48.5 % (ref 39.0–52.0)
Hemoglobin: 16.1 g/dL (ref 13.0–17.0)
Immature Granulocytes: 0 %
Lymphocytes Relative: 25 %
Lymphs Abs: 2.9 10*3/uL (ref 0.7–4.0)
MCH: 31.6 pg (ref 26.0–34.0)
MCHC: 33.2 g/dL (ref 30.0–36.0)
MCV: 95.3 fL (ref 80.0–100.0)
Monocytes Absolute: 1.1 10*3/uL — ABNORMAL HIGH (ref 0.1–1.0)
Monocytes Relative: 9 %
Neutro Abs: 7.2 10*3/uL (ref 1.7–7.7)
Neutrophils Relative %: 61 %
Platelets: 230 10*3/uL (ref 150–400)
RBC: 5.09 MIL/uL (ref 4.22–5.81)
RDW: 13 % (ref 11.5–15.5)
WBC: 11.8 10*3/uL — ABNORMAL HIGH (ref 4.0–10.5)
nRBC: 0 % (ref 0.0–0.2)

## 2019-04-10 LAB — TROPONIN I: Troponin I: <0.03 ng/mL (ref <0.03)

## 2019-04-10 MED ORDER — HYDROCODONE-ACETAMINOPHEN 5-325 MG PO TABS
1.0000 | ORAL_TABLET | Freq: Once | ORAL | Status: AC
  Administered 2019-04-10: 05:00:00 1 via ORAL
  Filled 2019-04-10: qty 1

## 2019-04-10 MED ORDER — LIDOCAINE 5 % EX PTCH: 1.0000 | MEDICATED_PATCH | Freq: Two times a day (BID) | CUTANEOUS | 0 refills | Status: AC

## 2019-04-10 MED ORDER — LIDOCAINE 5 % EX PTCH
1.0000 | MEDICATED_PATCH | CUTANEOUS | Status: DC
  Administered 2019-04-10: 1 via TRANSDERMAL
  Filled 2019-04-10: qty 1

## 2019-04-10 NOTE — ED Notes (Signed)
Peripheral IV discontinued. Catheter intact. No signs of infiltration or redness. Gauze applied to IV site.   Discharge instructions reviewed with patient. Questions fielded by this RN. Patient verbalizes understanding of instructions. Patient discharged home in stable condition per robinson. No acute distress noted at time of discharge.   

## 2019-04-10 NOTE — ED Notes (Signed)
Pt reports sharp pain at left mid lateral chest, worse with palpation, pt reports allergies and reports taking OTC and prescription meds to resolve head congestion, pt takes zisol and Singulair and pseudoephed nightly, pt also reports hx of anxiety at work  Node palpated in area of pt concern, pain with light palpation

## 2019-04-10 NOTE — ED Triage Notes (Signed)
Reports chest pain off/on for the past week.  Tonight worse.  Reports sharpe in nature.

## 2019-04-10 NOTE — ED Notes (Signed)
ED Provider at bedside. 
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
## 2019-04-11 ENCOUNTER — Ambulatory Visit (INDEPENDENT_AMBULATORY_CARE_PROVIDER_SITE_OTHER): Payer: Federal, State, Local not specified - PPO | Admitting: Medical

## 2019-04-11 ENCOUNTER — Encounter: Payer: Self-pay | Admitting: Medical

## 2019-04-11 VITALS — BP 111/73

## 2019-04-11 DIAGNOSIS — R0781 Pleurodynia: Secondary | ICD-10-CM

## 2019-04-11 DIAGNOSIS — R911 Solitary pulmonary nodule: Secondary | ICD-10-CM

## 2019-04-11 MED ORDER — DICLOFENAC SODIUM 75 MG PO TBEC: 75.0000 mg | DELAYED_RELEASE_TABLET | Freq: Two times a day (BID) | ORAL | 0 refills | Status: DC
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
## 2019-04-11 NOTE — Patient Instructions (Addendum)
For left rib region pain, I do think it is a good idea to continue with the lidocaine patches as that did seem to help.  Also will refill your former diclofenac as this helps with rib region pain in the past as well.  Since his pain is recurrent and chronic in nature, I do think is a good idea to get sports medicine referral.  We will get their opinion and it is possible they could even ultrasound the area which she think a swollen.  Though given I cannot guarantee that sports medicine would do that ultrasound.  For history of pulmonary nodule, will go ahead and place CT of chest order without contrast.  This might be beneficial as well and evaluating his region of pain.  Encouraged patient to go ahead and keep his cardiologist appointment tomorrow.  For history of allergies, keep allergist appointment this coming Friday.  If signs and symptoms change or worsen notify us.  Recommend follow-up late July or early August for CPE/wellness exam to include fasting labs.

## 2019-04-12 ENCOUNTER — Telehealth (INDEPENDENT_AMBULATORY_CARE_PROVIDER_SITE_OTHER): Payer: Federal, State, Local not specified - PPO | Admitting: Cardiovascular Disease

## 2019-04-12 ENCOUNTER — Other Ambulatory Visit: Payer: Self-pay

## 2019-04-12 DIAGNOSIS — R0781 Pleurodynia: Secondary | ICD-10-CM

## 2019-04-12 DIAGNOSIS — K219 Gastro-esophageal reflux disease without esophagitis: Secondary | ICD-10-CM

## 2019-04-12 DIAGNOSIS — R0789 Other chest pain: Secondary | ICD-10-CM | POA: Diagnosis not present

## 2019-04-12 DIAGNOSIS — F17200 Nicotine dependence, unspecified, uncomplicated: Secondary | ICD-10-CM | POA: Diagnosis not present
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
## 2019-04-12 NOTE — Patient Instructions (Signed)

## 2019-04-14 ENCOUNTER — Ambulatory Visit: Payer: Federal, State, Local not specified - PPO | Admitting: Family Medicine

## 2019-04-14 ENCOUNTER — Ambulatory Visit: Payer: Self-pay

## 2019-04-14 ENCOUNTER — Other Ambulatory Visit: Payer: Self-pay

## 2019-04-14 ENCOUNTER — Encounter: Payer: Self-pay | Admitting: Family Medicine

## 2019-04-14 VITALS — BP 114/77 | HR 68 | Ht 74.0 in | Wt 208.0 lb

## 2019-04-14 DIAGNOSIS — R0781 Pleurodynia: Secondary | ICD-10-CM | POA: Diagnosis not present

## 2019-04-14 MED ORDER — DICLOFENAC SODIUM 2 % TD SOLN: 1.0000 "application " | Freq: Two times a day (BID) | TRANSDERMAL | 2 refills | Status: DC

## 2019-04-14 NOTE — Assessment & Plan Note (Signed)
Possible that his nerve in that region has been irritated.  It is extending anteriorly from where his bite occurred in the mid axillary region.  Unclear as to why there is such a change as to when the bite occurred and then his symptoms initiated.  Possible that is related to a nerve in the thoracic spine being impinged.  Less likely for shingles. -Pennsaid. -If no improvement can consider MRI or gabapentin.  Could consider lidocaine injection in the area
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
## 2019-04-14 NOTE — Patient Instructions (Signed)
Nice to meet you  Please try the rub on medicine when you finish the anti-inflammatory by mouth  Please send me a message on MyChart with questions or updates.  We can follow up in 1-2 months if this doesn't seem to be getting any better.

## 2019-04-15 DIAGNOSIS — H6980 Other specified disorders of Eustachian tube, unspecified ear: Secondary | ICD-10-CM | POA: Diagnosis not present

## 2019-04-15 DIAGNOSIS — J301 Allergic rhinitis due to pollen: Secondary | ICD-10-CM | POA: Diagnosis not present

## 2019-04-19 ENCOUNTER — Telehealth: Payer: Self-pay | Admitting: Medical

## 2019-04-19 ENCOUNTER — Encounter: Payer: Self-pay | Admitting: Medical

## 2019-04-19 MED ORDER — MONTELUKAST SODIUM 10 MG PO TABS: 10.0000 mg | ORAL_TABLET | Freq: Every day | ORAL | 3 refills | Status: DC

## 2019-04-19 MED ORDER — LEVOCETIRIZINE DIHYDROCHLORIDE 5 MG PO TABS: 5.0000 mg | ORAL_TABLET | Freq: Every evening | ORAL | 3 refills | Status: DC

## 2019-04-19 NOTE — Telephone Encounter (Signed)
Montelukast and xyzal Rx was sent to pt pharmacy.

## 2019-04-19 NOTE — Telephone Encounter (Signed)
Xyzal not on med list.

## 2019-04-20 ENCOUNTER — Other Ambulatory Visit: Payer: Self-pay

## 2019-04-20 ENCOUNTER — Telehealth: Payer: Self-pay | Admitting: Medical

## 2019-04-20 ENCOUNTER — Ambulatory Visit (HOSPITAL_BASED_OUTPATIENT_CLINIC_OR_DEPARTMENT_OTHER)
Admission: RE | Admit: 2019-04-20 | Discharge: 2019-04-20 | Disposition: A | Discharge status: Home / Self Care | Payer: Federal, State, Local not specified - PPO | Source: Ambulatory Visit | Attending: Medical | Admitting: Medical

## 2019-04-20 DIAGNOSIS — R911 Solitary pulmonary nodule: Secondary | ICD-10-CM

## 2019-04-20 DIAGNOSIS — R918 Other nonspecific abnormal finding of lung field: Secondary | ICD-10-CM | POA: Diagnosis not present

## 2019-04-20 DIAGNOSIS — I7090 Unspecified atherosclerosis: Secondary | ICD-10-CM

## 2019-04-20 NOTE — Telephone Encounter (Signed)
Future labs placed. 

## 2019-04-22 ENCOUNTER — Telehealth: Payer: Self-pay | Admitting: Cardiovascular Disease

## 2019-04-22 NOTE — Telephone Encounter (Signed)
Call to patient. He is wanting Dr. Mariah Milling to be aware that chest CT was completed and in mychart.   He has an e visit with Dr. Mariah Milling on Monday and wanted to give him time to review.   Pt has labs ordered in Epic which include fasting lipid that he is taking on Monday morning.   Routed to Dr. Mariah Milling as Lorain Childes.

## 2019-04-22 NOTE — Telephone Encounter (Signed)
Mark Duncan calling Mark Duncan had an appt on 5/5 and was referred to have a CT scan Mark Duncan completed scan and would like for it to be reviewed by [place] Mark Duncan requested to schedule a follow up on 5/18 to speak with Dr Mariah Milling States he has more calcification than 2019 Please advise
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
## 2019-04-25 ENCOUNTER — Telehealth (INDEPENDENT_AMBULATORY_CARE_PROVIDER_SITE_OTHER): Payer: Federal, State, Local not specified - PPO | Admitting: Cardiovascular Disease

## 2019-04-25 ENCOUNTER — Other Ambulatory Visit: Payer: Self-pay

## 2019-04-25 DIAGNOSIS — F17200 Nicotine dependence, unspecified, uncomplicated: Secondary | ICD-10-CM

## 2019-04-25 DIAGNOSIS — I2510 Atherosclerotic heart disease of native coronary artery without angina pectoris: Secondary | ICD-10-CM

## 2019-04-25 DIAGNOSIS — I2584 Coronary atherosclerosis due to calcified coronary lesion: Secondary | ICD-10-CM

## 2019-04-25 DIAGNOSIS — E782 Mixed hyperlipidemia: Secondary | ICD-10-CM

## 2019-04-25 DIAGNOSIS — K219 Gastro-esophageal reflux disease without esophagitis: Secondary | ICD-10-CM

## 2019-04-25 HISTORY — DX: Atherosclerotic heart disease of native coronary artery without angina pectoris: I25.10

## 2019-04-25 HISTORY — DX: Mixed hyperlipidemia: E78.2

## 2019-04-25 MED ORDER — ROSUVASTATIN CALCIUM 20 MG PO TABS: 20.0000 mg | ORAL_TABLET | Freq: Every day | ORAL | 3 refills | Status: DC

## 2019-04-25 NOTE — Patient Instructions (Addendum)
Medication Instructions:  Your physician has recommended you make the following change in your medication:  1. START Rosuvastatin (Crestor) 20 mg 1 tablet daily.  If you need a refill on your cardiac medications before your next appointment, please call your pharmacy.    Lab work: We will need a liver and lipid profile in 3 months. So around August 18th 2020 go to the [facility] Entrance of the hospital and check in at the front desk to get those done. Make sure you do not eat or drink anything after midnight before except small sip of water with pills.    If you have labs (blood work) drawn today and your tests are completely normal, you will receive your results only by: Marland Kitchen MyChart Message (if you have MyChart) OR . A paper copy in the mail If you have any lab test that is abnormal or we need to change your treatment, we will call you to review the results.   Testing/Procedures: No new testing needed   Follow-Up: At [facility], you and your health needs are our priority.  As part of our continuing mission to provide you with exceptional heart care, we have created designated Provider Care Teams.  These Care Teams include your primary Cardiologist (physician) and Advanced Practice Providers (APPs -  Physician Assistants and Nurse Practitioners) who all work together to provide you with the care you need, when you need it.  . You will need a follow up appointment as needed  . Providers on your designated Care Team:   . Nicolasa Ducking, NP . Eula Listen, PA-C . Marisue Ivan, PA-C  Any Other Special Instructions Will Be Listed Below (If Applicable).  For educational health videos Log in to : [email or web address] Or : [email or web address], password : [email or web address]

## 2019-05-04 ENCOUNTER — Other Ambulatory Visit: Payer: Self-pay | Admitting: Medical

## 2019-05-05 MED ORDER — FAMOTIDINE 20 MG PO TABS: ORAL_TABLET | ORAL | 5 refills | Status: DC

## 2019-05-12 ENCOUNTER — Ambulatory Visit: Payer: Federal, State, Local not specified - PPO | Admitting: Family Medicine

## 2019-05-12 NOTE — Progress Notes (Deleted)
  Mark Duncan - 48 y.o. male MRN [id number]  Date of birth: [birth year]  SUBJECTIVE:  Including CC & ROS.  No chief complaint on file.   Mark Duncan is a 48 y.o. male that is  ***.  ***   Review of Systems  HISTORY: Past Medical, Surgical, Social, and Family History Reviewed & Updated per EMR.   Pertinent Historical Findings include:  Past Medical History:  Diagnosis Date  . Blood transfusion without reported diagnosis   . GERD (gastroesophageal reflux disease)   . Hyperlipidemia     Past Surgical History:  Procedure Laterality Date  . HAND SURGERY     Glass removed  . KNEE ARTHROSCOPY W/ MENISCAL REPAIR      Allergies  Allergen Reactions  . Fish Allergy Hives, Itching and Swelling    Grouper Only    Family History  Problem Relation Age of Onset  . Heart disease Maternal Uncle   . Heart disease Maternal Grandmother      Social History   Socioeconomic History  . Marital status: Married    Spouse name: Not on file  . Number of children: Not on file  . Years of education: Not on file  . Highest education level: Not on file  Occupational History  . Not on file  Social Needs  . Financial resource strain: Not on file  . Food insecurity:    Worry: Not on file    Inability: Not on file  . Transportation needs:    Medical: Not on file    Non-medical: Not on file  Tobacco Use  . Smoking status: Light Tobacco Smoker  . Smokeless tobacco: Former Engineer, water and Sexual Activity  . Alcohol use: No    Alcohol/week: 0.0 standard drinks  . Drug use: Not on file  . Sexual activity: Not on file  Lifestyle  . Physical activity:    Days per week: Not on file    Minutes per session: Not on file  . Stress: Not on file  Relationships  . Social connections:    Talks on phone: Not on file    Gets together: Not on file    Attends religious service: Not on file    Active member of club or organization: Not on file    Attends meetings of clubs or  organizations: Not on file    Relationship status: Not on file  . Intimate partner violence:    Fear of current or ex partner: Not on file    Emotionally abused: Not on file    Physically abused: Not on file    Forced sexual activity: Not on file  Other Topics Concern  . Not on file  Social History Narrative  . Not on file     PHYSICAL EXAM:  VS: There were no vitals taken for this visit. Physical Exam Gen: NAD, alert, cooperative with exam, well-appearing ENT: normal lips, normal nasal mucosa,  Eye: normal EOM, normal conjunctiva and lids CV:  no edema, +2 pedal pulses   Resp: no accessory muscle use, non-labored,  GI: no masses or tenderness, no hernia  Skin: no rashes, no areas of induration  Neuro: normal tone, normal sensation to touch Psych:  normal insight, alert and oriented MSK:  ***      ASSESSMENT & PLAN:   No problem-specific Assessment & Plan notes found for this encounter.

## 2019-05-13 ENCOUNTER — Encounter: Payer: Self-pay | Admitting: Medical

## 2019-07-11 ENCOUNTER — Other Ambulatory Visit: Payer: Self-pay | Admitting: Medical

## 2019-07-20 ENCOUNTER — Encounter: Payer: Federal, State, Local not specified - PPO | Admitting: Medical

## 2019-07-27 ENCOUNTER — Encounter: Payer: Federal, State, Local not specified - PPO | Admitting: Medical

## 2019-08-17 ENCOUNTER — Encounter: Payer: Federal, State, Local not specified - PPO | Admitting: Medical

## 2019-08-17 DIAGNOSIS — Z0289 Encounter for other administrative examinations: Secondary | ICD-10-CM

## 2019-08-21 ENCOUNTER — Other Ambulatory Visit: Payer: Self-pay | Admitting: Medical

## 2019-10-29 ENCOUNTER — Other Ambulatory Visit: Payer: Self-pay

## 2019-10-29 ENCOUNTER — Encounter (HOSPITAL_BASED_OUTPATIENT_CLINIC_OR_DEPARTMENT_OTHER): Payer: Self-pay | Admitting: Emergency Medicine

## 2019-10-29 ENCOUNTER — Emergency Department (HOSPITAL_BASED_OUTPATIENT_CLINIC_OR_DEPARTMENT_OTHER)
Admission: EM | Admit: 2019-10-29 | Discharge: 2019-10-29 | Disposition: A | Discharge status: Home / Self Care | Payer: Federal, State, Local not specified - PPO | Attending: Emergency Medicine | Admitting: Emergency Medicine

## 2019-10-29 DIAGNOSIS — M7918 Myalgia, other site: Secondary | ICD-10-CM | POA: Diagnosis not present

## 2019-10-29 DIAGNOSIS — Z79899 Other long term (current) drug therapy: Secondary | ICD-10-CM | POA: Insufficient documentation

## 2019-10-29 DIAGNOSIS — E782 Mixed hyperlipidemia: Secondary | ICD-10-CM | POA: Diagnosis not present

## 2019-10-29 DIAGNOSIS — R05 Cough: Secondary | ICD-10-CM | POA: Insufficient documentation

## 2019-10-29 DIAGNOSIS — Z91018 Allergy to other foods: Secondary | ICD-10-CM | POA: Insufficient documentation

## 2019-10-29 DIAGNOSIS — Z20828 Contact with and (suspected) exposure to other viral communicable diseases: Secondary | ICD-10-CM | POA: Diagnosis not present

## 2019-10-29 DIAGNOSIS — R52 Pain, unspecified: Secondary | ICD-10-CM

## 2019-10-29 DIAGNOSIS — M791 Myalgia, unspecified site: Secondary | ICD-10-CM | POA: Diagnosis not present

## 2019-10-29 DIAGNOSIS — R112 Nausea with vomiting, unspecified: Secondary | ICD-10-CM

## 2019-10-29 DIAGNOSIS — Z720 Tobacco use: Secondary | ICD-10-CM | POA: Insufficient documentation

## 2019-10-29 DIAGNOSIS — R519 Headache, unspecified: Secondary | ICD-10-CM | POA: Diagnosis not present

## 2019-10-29 DIAGNOSIS — R197 Diarrhea, unspecified: Secondary | ICD-10-CM | POA: Diagnosis not present

## 2019-10-29 MED ORDER — PROMETHAZINE HCL 25 MG PO TABS
25.0000 mg | ORAL_TABLET | Freq: Once | ORAL | Status: DC
  Filled 2019-10-29: qty 1

## 2019-10-29 MED ORDER — PROMETHAZINE HCL 25 MG PO TABS: 25.0000 mg | ORAL_TABLET | Freq: Four times a day (QID) | ORAL | 0 refills | Status: DC | PRN

## 2019-10-29 NOTE — ED Notes (Signed)
Pt verbalized understanding of dc instructions.

## 2019-10-29 NOTE — ED Triage Notes (Signed)
Patient states that he started to have a cough, body aches and headache last night  - patient reports that he also has N/V/D  - he took 2 hydrocodone for the headache and it has not helped

## 2019-10-29 NOTE — Discharge Instructions (Signed)
Please rest and drink plenty of fluids Quarantine at home until you receive your results Take Tylenol or Ibuprofen for fever or body aches Take Phenergan for nausea Please return if worsening

## 2019-10-29 NOTE — ED Provider Notes (Signed)
[place] EMERGENCY DEPARTMENT Provider Note   CSN: [id number] Arrival date & time: 10/29/19  1542     History   Chief Complaint Chief Complaint  Patient presents with  . Cough    r/o COVID  . Generalized Body Aches    HPI Mark Duncan is a 48 y.o. male who presents with nausea and diarrhea. He states that yesterday when he got home from work he started to have a headache, nausea, and multiple episodes of diarrhea as well as body aches. His wife and kids are not at home at this time. Today he decided to come to the ED because he felt so bad. He denies fever, chills, chest pain, SOB, cough, abdominal pain, vomiting, urinary symptoms. He works in [employer] and is in contact with multiple people. He took hydrocodone that he had left over with temporary relief.     HPI  Past Medical History:  Diagnosis Date  . Blood transfusion without reported diagnosis   . GERD (gastroesophageal reflux disease)   . Hyperlipidemia     Patient Active Problem List   Diagnosis Date Noted  . Coronary artery calcification of native artery 04/25/2019  . Mixed hyperlipidemia 04/25/2019  . Atypical chest pain 05/24/2015  . Rib pain on left side 05/24/2015  . GERD (gastroesophageal reflux disease) 05/24/2015  . History of bloody stools 05/24/2015  . Smoker 05/24/2015    Past Surgical History:  Procedure Laterality Date  . HAND SURGERY     Glass removed  . KNEE ARTHROSCOPY W/ MENISCAL REPAIR          Home Medications    Prior to Admission medications   Medication Sig Start Date End Date Taking? Authorizing Provider  azithromycin (ZITHROMAX) 250 MG tablet Take 2 tablets by mouth on day 1, followed by 1 tablet by mouth daily for 4 days. 02/23/19   Saguier, Percell Miller, PA-C  diclofenac (VOLTAREN) 75 MG EC tablet Take 1 tablet (75 mg total) by mouth 2 (two) times daily. 04/11/19   Saguier, Percell Miller, PA-C  Diclofenac Sodium (PENNSAID) 2 % SOLN Place 1 application onto  the skin 2 (two) times daily. 04/14/19   Rosemarie Ax, MD  esomeprazole (NEXIUM) 40 MG capsule Take 1 capsule (40 mg total) by mouth daily. 01/18/19   Martinique, Betty G, MD  famotidine (PEPCID) 20 MG tablet 1-2 tab po q day 05/05/19   Saguier, Percell Miller, PA-C  levocetirizine (XYZAL) 5 MG tablet TAKE ONE TABLET BY MOUTH EVERY EVENING 08/22/19   Saguier, Percell Miller, PA-C  lidocaine (LIDODERM) 5 % Place 1 patch onto the skin every 12 (twelve) hours. Remove & Discard patch within 12 hours or as directed by MD 04/10/19 04/09/20  Merlyn Lot, MD  mometasone (NASONEX) 50 MCG/ACT nasal spray Place 2 sprays into the nose daily. 01/18/19   [place]ique, Betty G, MD  montelukast (SINGULAIR) 10 MG tablet Take 1 tablet (10 mg total) by mouth at bedtime. 04/19/19   Saguier, Percell Miller, PA-C  predniSONE (DELTASONE) 10 MG tablet 4 tab po day 1, 3 tab po day 2, 2 tab po day 3, 1 tab po day 4 01/28/19   Saguier, Percell Miller, PA-C  promethazine (PHENERGAN) 25 MG tablet Take 1 tablet (25 mg total) by mouth every 6 (six) hours as needed for nausea or vomiting. 10/29/19   Recardo Evangelist, PA-C  rosuvastatin (CRESTOR) 20 MG tablet Take 1 tablet (20 mg total) by mouth daily. 04/25/19 07/24/19  Minna Merritts, MD    Family  History Family History  Problem Relation Age of Onset  . Heart disease Maternal Uncle   . Heart disease Maternal Grandmother     Social History Social History   Tobacco Use  . Smoking status: Light Tobacco Smoker  . Smokeless tobacco: Former Engineer, water Use Topics  . Alcohol use: No    Alcohol/week: 0.0 standard drinks  . Drug use: Not on file     Allergies   Fish allergy   Review of Systems Review of Systems  Constitutional: Negative for chills and fever.  Respiratory: Negative for shortness of breath.   Cardiovascular: Negative for chest pain.  Gastrointestinal: Positive for diarrhea and nausea. Negative for abdominal pain and vomiting.  Genitourinary: Negative for dysuria.  All other systems  reviewed and are negative.    Physical Exam Updated Vital Signs BP (!) 145/85 (BP Location: Left Arm)   Pulse 81   Temp 98.9 F (37.2 C) (Oral)   Resp 18   Ht 6\' 2"  (1.88 m)   Wt 93 kg   SpO2 100%   BMI 26.32 kg/m   Physical Exam Vitals signs and nursing note reviewed.  Constitutional:      General: He is not in acute distress.    Appearance: Normal appearance. He is well-developed. He is not ill-appearing.  HENT:     Head: Normocephalic and atraumatic.     Right Ear: Tympanic membrane normal.     Left Ear: Tympanic membrane normal.     Nose: Nose normal.     Mouth/Throat:     Mouth: Mucous membranes are moist.  Eyes:     General: No scleral icterus.       Right eye: No discharge.        Left eye: No discharge.     Conjunctiva/sclera: Conjunctivae normal.     Pupils: Pupils are equal, round, and reactive to light.  Neck:     Musculoskeletal: Normal range of motion.  Cardiovascular:     Rate and Rhythm: Normal rate and regular rhythm.  Pulmonary:     Effort: Pulmonary effort is normal. No respiratory distress.     Breath sounds: Normal breath sounds.  Abdominal:     General: There is no distension.     Palpations: Abdomen is soft.     Tenderness: There is no abdominal tenderness.  Skin:    General: Skin is warm and dry.  Neurological:     Mental Status: He is alert and oriented to person, place, and time.  Psychiatric:        Behavior: Behavior normal.      ED Treatments / Results  Labs (all labs ordered are listed, but only abnormal results are displayed) Labs Reviewed  NOVEL CORONAVIRUS, NAA (HOSP ORDER, SEND-OUT TO REF LAB; TAT 18-24 HRS)    EKG None  Radiology No results found.  Procedures Procedures (including critical care time)  Medications Ordered in ED Medications - No data to display   Initial Impression / Assessment and Plan / ED Course  I have reviewed the triage vital signs and the nursing notes.  Pertinent labs & imaging  results that were available during my care of the patient were reviewed by me and considered in my medical decision making (see chart for details).  48 year old male presents with headache, body aches, nausea and diarrhea. Likely viral. Pt is concerned about COVID but has no respiratory symptoms. He is mildly hypertensive but otherwise vitals are normal. Abdomen is soft, non-tender. He  was offered IV fluids but declined stating he can drink water at home. He reports intolerance to Zofran (gives him insomnia) so will give rx for phenergan. COVID test was sent out. He was advised to return if worsening   Mark Duncan was evaluated in Emergency Department on 10/29/2019 for the symptoms described in the history of present illness. He was evaluated in the context of the global COVID-19 pandemic, which necessitated consideration that the patient might be at risk for infection with the SARS-CoV-2 virus that causes COVID-19. Institutional protocols and algorithms that pertain to the evaluation of patients at risk for COVID-19 are in a state of rapid change based on information released by regulatory bodies including the CDC and federal and state organizations. These policies and algorithms were followed during the patient's care in the ED.   Final Clinical Impressions(s) / ED Diagnoses   Final diagnoses:  Nausea vomiting and diarrhea  Body aches  Acute nonintractable headache, unspecified headache type    ED Discharge Orders         Ordered    promethazine (PHENERGAN) 25 MG tablet  Every 6 hours PRN     10/29/19 1715           Bethel BornGekas, Kasra Melvin Marie, PA-C 10/29/19 2311    Charlynne PanderYao, David Hsienta, MD 10/30/19 [id number]

## 2019-10-31 ENCOUNTER — Ambulatory Visit (INDEPENDENT_AMBULATORY_CARE_PROVIDER_SITE_OTHER): Payer: Federal, State, Local not specified - PPO | Admitting: Medical

## 2019-10-31 ENCOUNTER — Encounter: Payer: Self-pay | Admitting: Medical

## 2019-10-31 ENCOUNTER — Telehealth: Payer: Federal, State, Local not specified - PPO

## 2019-10-31 ENCOUNTER — Other Ambulatory Visit: Payer: Self-pay

## 2019-10-31 ENCOUNTER — Ambulatory Visit: Payer: Self-pay | Admitting: *Deleted

## 2019-10-31 VITALS — Temp 99.1°F | Wt 205.0 lb

## 2019-10-31 DIAGNOSIS — R195 Other fecal abnormalities: Secondary | ICD-10-CM

## 2019-10-31 DIAGNOSIS — J3489 Other specified disorders of nose and nasal sinuses: Secondary | ICD-10-CM

## 2019-10-31 DIAGNOSIS — H9202 Otalgia, left ear: Secondary | ICD-10-CM | POA: Diagnosis not present

## 2019-10-31 LAB — NOVEL CORONAVIRUS, NAA (HOSP ORDER, SEND-OUT TO REF LAB; TAT 18-24 HRS): SARS-CoV-2, NAA: NOT DETECTED

## 2019-10-31 MED ORDER — FLUTICASONE PROPIONATE 50 MCG/ACT NA SUSP: 2.0000 | Freq: Every day | NASAL | 3 refills | Status: DC

## 2019-10-31 MED ORDER — AZELASTINE HCL 0.1 % NA SOLN: 2.0000 | Freq: Two times a day (BID) | NASAL | 3 refills | Status: DC

## 2019-10-31 MED ORDER — AZITHROMYCIN 250 MG PO TABS: ORAL_TABLET | ORAL | 0 refills | Status: DC

## 2019-10-31 NOTE — Telephone Encounter (Addendum)
Pt called with complaints of abdominal pain; he started having COVID symptoms starting 10/28/2019; he was seen in the ED on 11/21/2020and was tested for COVID however he started having abdominal pain on 10/30/2019 around 1900; he says that his pain lasted "many hours'; he rated his pain at 10 out ouf 10; he now rates his pain at 7 out of 10; the pt describes his pain as "something eating him"; he also has "stabbing" upper  back pain and pain in his right  shoulder; the pt also says he thinks he has a sinus infection because he has green nasal secretions when he blows his nose; the pt also says that he "feels dehydrated"; recommendations made per nurse triage protocol; he verbalized understanding; the pt sees [employer] [employer]; will route to [facility] [employer] [facility] notification.

## 2019-10-31 NOTE — Telephone Encounter (Signed)
  Reason for Disposition . [1] SEVERE pain (e.g., excruciating) AND [2] present > 1 hour  Answer Assessment - Initial Assessment Questions 1. LOCATION: "Where does it hurt?"    2. RADIATION: "Does the pain shoot anywhere else?" (e.g., chest, back)    Upper back and right shoulder 3. ONSET: "When did the pain begin?" (Minutes, hours or days ago)     10/30/2019 at 1900 4. SUDDEN: "Gradual or sudden onset?"      5. PATTERN "Does the pain come and go, or is it constant?"    - If constant: "Is it getting better, staying the same, or worsening?"      (Note: Constant means the pain never goes away completely; most serious pain is constant and it progresses)     - If intermittent: "How long does it last?" "Do you have pain now?"     (Note: Intermittent means the pain goes away completely between bouts)    constant 6. SEVERITY: "How bad is the pain?"  (e.g., Scale 1-10; mild, moderate, or severe)    - MILD (1-3): doesn't interfere with normal activities, abdomen soft and not tender to touch     - MODERATE (4-7): interferes with normal activities or awakens from sleep, tender to touch     - SEVERE (8-10): excruciating pain, doubled over, unable to do any normal activities     currently 7 out of 10 7. RECURRENT SYMPTOM: "Have you ever had this type of abdominal pain before?" If so, ask: "When was the last time?" and "What happened that time?"     8. CAUSE: "What do you think is causing the abdominal pain?"     9. RELIEVING/AGGRAVATING FACTORS: "What makes it better or worse?" (e.g., movement, antacids, bowel movement)   10. OTHER SYMPTOMS: "Has there been any vomiting, diarrhea, constipation, or urine problems?"      Feels dehydrated; pt also seen in ED 10/29/2019  Protocols used: ABDOMINAL PAIN - MALE-A-AH

## 2019-10-31 NOTE — Progress Notes (Signed)
Subjective:    Patient ID: Mark Duncan, male    DOB: 1971-09-13, 48 y.o.   MRN: [id number]  HPI   Virtual Visit via Video Note  I connected with Mark Duncan on 10/31/19 at  9:00 AM EST by a video enabled telemedicine application and verified that I am speaking with the correct person using two identifiers.  Location: Patient: home Provider: office   I discussed the limitations of evaluation and management by telemedicine and the availability of in person appointments. The patient expressed understanding and agreed to proceed.  History of Present Illness:   Pt has visit today for follow up from the ED. Below is ED visit hpi. Mark Duncan is a 48 y.o. male who presents with nausea and diarrhea. He states that yesterday when he got home from work he started to have a headache, nausea, and multiple episodes of diarrhea as well as body aches. His wife and kids are not at home at this time. Today he decided to come to the ED because he felt so bad. He denies fever, chills, chest pain, SOB, cough, abdominal pain, vomiting, urinary symptoms. He works in [employer] and is in contact with multiple people. He took hydrocodone that he had left over with temporary relief.  Below is the A/P from the ED.  48 year old male presents with headache, body aches, nausea and diarrhea. Likely viral. Pt is concerned about COVID but has no respiratory symptoms. He is mildly hypertensive but otherwise vitals are normal. Abdomen is soft, non-tender. He was offered IV fluids but declined stating he can drink water at home. He reports intolerance to Zofran (gives him insomnia) so will give rx for phenergan. COVID test was sent out. He was advised to return if worsening   Mark Duncan was evaluated in Emergency Department on 10/29/2019 for the symptoms described in the history of present illness. He was evaluated in the context of the global COVID-19 pandemic, which necessitated  consideration that the patient might be at risk for infection with the SARS-CoV-2 virus that causes COVID-19. Institutional protocols and algorithms that pertain to the evaluation of patients at risk for COVID-19 are in a state of rapid change based on information released by regulatory bodies including the CDC and federal and state organizations. These policies and algorithms were followed during the patient's care in the ED.  Pt summarizes to me hpi and similar to above.  He states his symptoms are still present. Nasal congestion feels worse, some sinus pressure and left ear pain in addition to the above. On Saturday when he blows nose gets chunks of mucus. He is able to taste and smell.    Pt has chronic sinus pressure and ear pain. ENT he saw had mentioned getting ct of his sinus. But he did not follow up with ent on that per his report. Associated ear pressure. States this has been present for 2 years.  He states abdomen discomfort last night. His loose stools have improved. Pt has hydrated well and eating a lot of soups. He does have upper back myalgias.  Pt nausea is less presently than this weekend and was given zofran.    Observations/Objective:  General-no acute distress, pleasant, oriented. Lungs- on inspection lungs appear unlabored. Neck- no tracheal deviation or jvd on inspection. Neuro- gross motor function appears intact. heent- left side sinus pressure.   Assessment and Plan: Recent constellation of signs/symptoms described in the emergency department note that could represent  Covid.  Covid test is pending and patient is going to stay at home quarantine until he gets results and until we assess clinical response to the treatment.  He does have history of some recurrent sinus pressure/occasional infections and ear pressure/pain.  For this will prescribe a azithromycin antibiotic.  Also would recommend that he get back on Flonase nasal spray and will also prescribe Astelin.   We will see how he responds to treatment.  Did go ahead and try to place CT sinus order as he reports the ENT which he is in the past did mention possibly getting this.  Will follow and see if insurance authorizes the study.  If not might need to refer him back to ENT.  Follow-up in 7 to 10 days or as needed.  Follow Up Instructions:    I discussed the assessment and treatment plan with the patient. The patient was provided an opportunity to ask questions and all were answered. The patient agreed with the plan and demonstrated an understanding of the instructions.   The patient was advised to call back or seek an in-person evaluation if the symptoms worsen or if the condition fails to improve as anticipated.  I provided 25 minutes of non-face-to-face time during this encounter.   Mark Pai, PA-C    Review of Systems  Constitutional: Negative for chills and fatigue.  HENT: Positive for ear pain, sinus pressure and sinus pain. Negative for dental problem.   Respiratory: Positive for cough. Negative for choking, shortness of breath and wheezing.   Cardiovascular: Negative for chest pain and palpitations.  Gastrointestinal: Positive for abdominal pain. Negative for diarrhea and nausea.       Loose stools.  Musculoskeletal: Negative for gait problem.  Skin: Negative for rash.  Neurological: Negative for dizziness and numbness.  Hematological: Negative for adenopathy. Does not bruise/bleed easily.  Psychiatric/Behavioral: Negative for behavioral problems, confusion and sleep disturbance. The patient is not nervous/anxious.     Past Medical History:  Diagnosis Date  . Blood transfusion without reported diagnosis   . GERD (gastroesophageal reflux disease)   . Hyperlipidemia      Social History   Socioeconomic History  . Marital status: Married    Spouse name: Not on file  . Number of children: Not on file  . Years of education: Not on file  . Highest education level: Not on  file  Occupational History  . Not on file  Social Needs  . Financial resource strain: Not on file  . Food insecurity    Worry: Not on file    Inability: Not on file  . Transportation needs    Medical: Not on file    Non-medical: Not on file  Tobacco Use  . Smoking status: Light Tobacco Smoker  . Smokeless tobacco: Former Network engineer and Sexual Activity  . Alcohol use: No    Alcohol/week: 0.0 standard drinks  . Drug use: Not on file  . Sexual activity: Not on file  Lifestyle  . Physical activity    Days per week: Not on file    Minutes per session: Not on file  . Stress: Not on file  Relationships  . Social Herbalist on phone: Not on file    Gets together: Not on file    Attends religious service: Not on file    Active member of club or organization: Not on file    Attends meetings of clubs or organizations: Not on file  Relationship status: Not on file  . Intimate partner violence    Fear of current or ex partner: Not on file    Emotionally abused: Not on file    Physically abused: Not on file    Forced sexual activity: Not on file  Other Topics Concern  . Not on file  Social History Narrative  . Not on file    Past Surgical History:  Procedure Laterality Date  . HAND SURGERY     Glass removed  . KNEE ARTHROSCOPY W/ MENISCAL REPAIR      Family History  Problem Relation Age of Onset  . Heart disease Maternal Uncle   . Heart disease Maternal Grandmother     Allergies  Allergen Reactions  . Fish Allergy Hives, Itching and Swelling    Grouper Only    Current Outpatient Medications on File Prior to Visit  Medication Sig Dispense Refill  . azithromycin (ZITHROMAX) 250 MG tablet Take 2 tablets by mouth on day 1, followed by 1 tablet by mouth daily for 4 days. 6 tablet 0  . diclofenac (VOLTAREN) 75 MG EC tablet Take 1 tablet (75 mg total) by mouth 2 (two) times daily. 30 tablet 0  . Diclofenac Sodium (PENNSAID) 2 % SOLN Place 1 application  onto the skin 2 (two) times daily. 1 Bottle 2  . esomeprazole (NEXIUM) 40 MG capsule Take 1 capsule (40 mg total) by mouth daily. 30 capsule 1  . famotidine (PEPCID) 20 MG tablet 1-2 tab po q day 60 tablet 5  . levocetirizine (XYZAL) 5 MG tablet TAKE ONE TABLET BY MOUTH EVERY EVENING 30 tablet 0  . lidocaine (LIDODERM) 5 % Place 1 patch onto the skin every 12 (twelve) hours. Remove & Discard patch within 12 hours or as directed by MD 10 patch 0  . mometasone (NASONEX) 50 MCG/ACT nasal spray Place 2 sprays into the nose daily. 17 g 1  . montelukast (SINGULAIR) 10 MG tablet Take 1 tablet (10 mg total) by mouth at bedtime. 30 tablet 3  . predniSONE (DELTASONE) 10 MG tablet 4 tab po day 1, 3 tab po day 2, 2 tab po day 3, 1 tab po day 4 10 tablet 0  . promethazine (PHENERGAN) 25 MG tablet Take 1 tablet (25 mg total) by mouth every 6 (six) hours as needed for nausea or vomiting. 10 tablet 0  . rosuvastatin (CRESTOR) 20 MG tablet Take 1 tablet (20 mg total) by mouth daily. 90 tablet 3   No current facility-administered medications on file prior to visit.     Temp 99.1 F (37.3 C) (Temporal)   Wt 205 lb (93 kg)   BMI 26.32 kg/m       Objective:   Physical Exam        Assessment & Plan:

## 2019-10-31 NOTE — Patient Instructions (Addendum)
Recent constellation of signs/symptoms described in the emergency department note that could represent Covid.  Covid test is pending and patient is going to stay at home quarantine until he gets results and until we assess clinical response to the treatment.  He does have history of some recurrent sinus pressure/occasional infections and ear pressure/pain.  For this will prescribe a azithromycin antibiotic.  Also would recommend that he get back on Flonase nasal spray and will also prescribe Astelin.  We will see how he responds to treatment.  Did go ahead and try to place CT sinus order as he reports the ENT which he is in the past did mention possibly getting this.  Will follow and see if insurance authorizes the study.  If not might need to refer him back to ENT.  Follow-up in 7 to 10 days or as needed.

## 2019-11-07 ENCOUNTER — Ambulatory Visit (HOSPITAL_BASED_OUTPATIENT_CLINIC_OR_DEPARTMENT_OTHER): Payer: Federal, State, Local not specified - PPO

## 2019-11-14 ENCOUNTER — Ambulatory Visit (HOSPITAL_BASED_OUTPATIENT_CLINIC_OR_DEPARTMENT_OTHER): Payer: Federal, State, Local not specified - PPO

## 2019-12-19 ENCOUNTER — Ambulatory Visit (INDEPENDENT_AMBULATORY_CARE_PROVIDER_SITE_OTHER): Payer: Federal, State, Local not specified - PPO | Admitting: Otolaryngology

## 2019-12-23 ENCOUNTER — Ambulatory Visit (INDEPENDENT_AMBULATORY_CARE_PROVIDER_SITE_OTHER): Payer: Federal, State, Local not specified - PPO | Admitting: Otolaryngology

## 2019-12-27 ENCOUNTER — Ambulatory Visit (INDEPENDENT_AMBULATORY_CARE_PROVIDER_SITE_OTHER): Payer: Federal, State, Local not specified - PPO | Admitting: Otolaryngology

## 2019-12-27 ENCOUNTER — Other Ambulatory Visit: Payer: Self-pay

## 2019-12-27 VITALS — Temp 98.1°F

## 2019-12-27 DIAGNOSIS — H6982 Other specified disorders of Eustachian tube, left ear: Secondary | ICD-10-CM

## 2019-12-27 DIAGNOSIS — J310 Chronic rhinitis: Secondary | ICD-10-CM

## 2019-12-27 NOTE — Progress Notes (Signed)
HPI: Mark Duncan is a 49 y.o. male who presents is referred by his PCP for evaluation of left ear complaints and sinus complaints.  Over the past 2 years he has had chronic problems with congestion of his left ear.  He also complains of left-sided head pressure whenever he lies down at night.  He does not do that bad during the daytime symptoms seem to be a lot worse when he lies down at night.  Symptoms are much worse on the left side.  He has been on a couple rounds of antibiotics including Z-Pak which seemed to provide some short-term relief.  He has previously seen an allergist and had essentially negative allergy testing.  He was scheduled for a CT scan of the sinuses but never had this done because of a possible Covid exposure.  He really does not notice much hearing problems in the office today but has sensation of congestion or blockage on the left side. He apparently has been on Flonase as well as saline irrigation as well as Astelin in the past but continues to have chronic symptoms.  Past Medical History:  Diagnosis Date  . Blood transfusion without reported diagnosis   . GERD (gastroesophageal reflux disease)   . Hyperlipidemia    Past Surgical History:  Procedure Laterality Date  . HAND SURGERY     Glass removed  . KNEE ARTHROSCOPY W/ MENISCAL REPAIR     Social History   Socioeconomic History  . Marital status: Married    Spouse name: Not on file  . Number of children: Not on file  . Years of education: Not on file  . Highest education level: Not on file  Occupational History  . Not on file  Tobacco Use  . Smoking status: Light Tobacco Smoker  . Smokeless tobacco: Former Network engineer and Sexual Activity  . Alcohol use: No    Alcohol/week: 0.0 standard drinks  . Drug use: Not on file  . Sexual activity: Not on file  Other Topics Concern  . Not on file  Social History Narrative  . Not on file   Social Determinants of Health   Financial Resource Strain:    . Difficulty of Paying Living Expenses: Not on file  Food Insecurity:   . Worried About Charity fundraiser in the Last Year: Not on file  . Ran Out of Food in the Last Year: Not on file  Transportation Needs:   . Lack of Transportation (Medical): Not on file  . Lack of Transportation (Non-Medical): Not on file  Physical Activity:   . Days of Exercise per Week: Not on file  . Minutes of Exercise per Session: Not on file  Stress:   . Feeling of Stress : Not on file  Social Connections:   . Frequency of Communication with Friends and Family: Not on file  . Frequency of Social Gatherings with Friends and Family: Not on file  . Attends Religious Services: Not on file  . Active Member of Clubs or Organizations: Not on file  . Attends Archivist Meetings: Not on file  . Marital Status: Not on file   Family History  Problem Relation Age of Onset  . Heart disease Maternal Uncle   . Heart disease Maternal Grandmother    Allergies  Allergen Reactions  . Fish Allergy Hives, Itching and Swelling    Grouper Only   Prior to Admission medications   Medication Sig Start Date End Date Taking? Authorizing Provider  azelastine (ASTELIN) 0.1 % nasal spray Place 2 sprays into both nostrils 2 (two) times daily. Use in each nostril as directed 10/31/19  Yes Saguier, Ramon Dredge, PA-C  azithromycin (ZITHROMAX) 250 MG tablet Take 2 tablets by mouth on day 1, followed by 1 tablet by mouth daily for 4 days. 10/31/19  Yes Saguier, Ramon Dredge, PA-C  diclofenac (VOLTAREN) 75 MG EC tablet Take 1 tablet (75 mg total) by mouth 2 (two) times daily. 04/11/19  Yes Saguier, Ramon Dredge, PA-C  Diclofenac Sodium (PENNSAID) 2 % SOLN Place 1 application onto the skin 2 (two) times daily. 04/14/19  Yes Myra Rude, MD  esomeprazole (NEXIUM) 40 MG capsule Take 1 capsule (40 mg total) by mouth daily. 01/18/19  Yes [place], Betty G, MD  famotidine (PEPCID) 20 MG tablet 1-2 tab po q day 05/05/19  Yes Saguier, Ramon Dredge, PA-C   fluticasone [facility]) 50 MCG/ACT nasal spray Place 2 sprays into both nostrils daily. 10/31/19  Yes Saguier, Ramon Dredge, PA-C  levocetirizine (XYZAL) 5 MG tablet TAKE ONE TABLET BY MOUTH EVERY EVENING 08/22/19  Yes Saguier, Ramon Dredge, PA-C  lidocaine (LIDODERM) 5 % Place 1 patch onto the skin every 12 (twelve) hours. Remove & Discard patch within 12 hours or as directed by MD 04/10/19 04/09/20 Yes Willy Eddy, MD  mometasone (NASONEX) 50 MCG/ACT nasal spray Place 2 sprays into the nose daily. 01/18/19  Yes [place], Betty G, MD  montelukast (SINGULAIR) 10 MG tablet Take 1 tablet (10 mg total) by mouth at bedtime. 04/19/19  Yes Saguier, Ramon Dredge, PA-C  predniSONE (DELTASONE) 10 MG tablet 4 tab po day 1, 3 tab po day 2, 2 tab po day 3, 1 tab po day 4 01/28/19  Yes Saguier, Ramon Dredge, PA-C  promethazine (PHENERGAN) 25 MG tablet Take 1 tablet (25 mg total) by mouth every 6 (six) hours as needed for nausea or vomiting. 10/29/19  Yes Bethel Born, PA-C  rosuvastatin (CRESTOR) 20 MG tablet Take 1 tablet (20 mg total) by mouth daily. 04/25/19 07/24/19  Antonieta Iba, MD     Positive ROS: Otherwise negative.  No vertigo or dizziness  All other systems have been reviewed and were otherwise negative with the exception of those mentioned in the HPI and as above.  Physical Exam: Constitutional: Alert, well-appearing, no acute distress Ears: External ears without lesions or tenderness. Ear canals are clear bilaterally with intact, clear TMs bilaterally with good mobility on pneumatic otoscopy.  No middle ear fluid noted.  On hearing screening with a 512 1024 tuning fork he heard well in both ears.  Hearing was symmetric and AC was greater than BC bilaterally. Nasal: External nose without lesions. Septum slightly deviated to the left.  Moderate rhinitis.  Both middle meatus regions were clear with no active mucopurulent discharge noted.  No polyps noted Oral: Lips and gums without lesions. Tongue and palate mucosa  without lesions. Posterior oropharynx clear. Neck: No palpable adenopathy or masses Respiratory: Breathing comfortably  Skin: No facial/neck lesions or rash noted.  Procedures  Assessment: Chronic rhinitis with possible eustachian tube dysfunction. Questionable chronic sinus disease Presently no evidence of otitis media with effusion.  Plan: Recommended continue use of his Flonase 2 sprays each nostril at night.  Reviewed with him concerning using saline nasal irrigation during the day as needed any symptoms. We will schedule him for a CT scan of his sinuses to better evaluate his sinuses. He will call us following the CT scan of the sinuses concerning results.   Narda Bonds, MD  CC:

## 2019-12-28 ENCOUNTER — Other Ambulatory Visit (INDEPENDENT_AMBULATORY_CARE_PROVIDER_SITE_OTHER): Payer: Self-pay

## 2019-12-28 DIAGNOSIS — J329 Chronic sinusitis, unspecified: Secondary | ICD-10-CM

## 2020-01-12 ENCOUNTER — Ambulatory Visit
Admission: RE | Admit: 2020-01-12 | Discharge: 2020-01-12 | Disposition: A | Discharge status: Home / Self Care | Payer: Federal, State, Local not specified - PPO | Source: Ambulatory Visit | Attending: Otolaryngology | Admitting: Otolaryngology

## 2020-01-12 ENCOUNTER — Other Ambulatory Visit: Payer: Self-pay

## 2020-01-12 ENCOUNTER — Encounter: Payer: Self-pay | Admitting: Medical

## 2020-01-12 DIAGNOSIS — J329 Chronic sinusitis, unspecified: Secondary | ICD-10-CM

## 2020-01-20 ENCOUNTER — Other Ambulatory Visit: Payer: Self-pay

## 2020-01-20 ENCOUNTER — Ambulatory Visit (INDEPENDENT_AMBULATORY_CARE_PROVIDER_SITE_OTHER): Payer: Federal, State, Local not specified - PPO | Admitting: Otolaryngology

## 2020-01-20 VITALS — Temp 97.7°F

## 2020-01-20 DIAGNOSIS — J342 Deviated nasal septum: Secondary | ICD-10-CM

## 2020-01-20 DIAGNOSIS — J310 Chronic rhinitis: Secondary | ICD-10-CM | POA: Diagnosis not present

## 2020-01-20 DIAGNOSIS — H6982 Other specified disorders of Eustachian tube, left ear: Secondary | ICD-10-CM | POA: Diagnosis not present

## 2020-01-20 NOTE — Progress Notes (Signed)
HPI: Mark Duncan is a 49 y.o. male who returns today for evaluation of ear and sinus complaints.  He felt like this all began with his ears with popping crackling in the left ear.  He still has popping and crackling in the left ear.  He felt like this led to sinus problems.  He is always had some difficulty breathing through his nose.  He has had some pressure or pain around the face and sinuses but this is doing better.  But he still complains of popping and cracking in the left ear.  He wants to have a tube placed in the left ear. I reviewed the CT scan in the office with the patient today and this showed a rather significant septal deviation to the left.  He had mild sinus disease with partial opacification of the [facility] regions bilaterally and a few ethmoid air cells also opacified..  The mastoids and middle ears were clear on CT scan.  Past Medical History:  Diagnosis Date  . Blood transfusion without reported diagnosis   . GERD (gastroesophageal reflux disease)   . Hyperlipidemia    Past Surgical History:  Procedure Laterality Date  . HAND SURGERY     Glass removed  . KNEE ARTHROSCOPY W/ MENISCAL REPAIR     Social History   Socioeconomic History  . Marital status: Married    Spouse name: Not on file  . Number of children: Not on file  . Years of education: Not on file  . Highest education level: Not on file  Occupational History  . Not on file  Tobacco Use  . Smoking status: Light Tobacco Smoker  . Smokeless tobacco: Former Engineer, water and Sexual Activity  . Alcohol use: No    Alcohol/week: 0.0 standard drinks  . Drug use: Not on file  . Sexual activity: Not on file  Other Topics Concern  . Not on file  Social History Narrative  . Not on file   Social Determinants of Health   Financial Resource Strain:   . Difficulty of Paying Living Expenses: Not on file  Food Insecurity:   . Worried About Programme researcher, broadcasting/film/video in the Last Year: Not on file  . Ran Out of Food  in the Last Year: Not on file  Transportation Needs:   . Lack of Transportation (Medical): Not on file  . Lack of Transportation (Non-Medical): Not on file  Physical Activity:   . Days of Exercise per Week: Not on file  . Minutes of Exercise per Session: Not on file  Stress:   . Feeling of Stress : Not on file  Social Connections:   . Frequency of Communication with Friends and Family: Not on file  . Frequency of Social Gatherings with Friends and Family: Not on file  . Attends Religious Services: Not on file  . Active Member of Clubs or Organizations: Not on file  . Attends Banker Meetings: Not on file  . Marital Status: Not on file   Family History  Problem Relation Age of Onset  . Heart disease Maternal Uncle   . Heart disease Maternal Grandmother    Allergies  Allergen Reactions  . Fish Allergy Hives, Itching and Swelling    Grouper Only   Prior to Admission medications   Medication Sig Start Date End Date Taking? Authorizing Provider  azelastine (ASTELIN) 0.1 % nasal spray Place 2 sprays into both nostrils 2 (two) times daily. Use in each nostril as directed 10/31/19  Yes Saguier, Ramon Dredge, PA-C  azithromycin (ZITHROMAX) 250 MG tablet Take 2 tablets by mouth on day 1, followed by 1 tablet by mouth daily for 4 days. 10/31/19  Yes Saguier, Ramon Dredge, PA-C  diclofenac (VOLTAREN) 75 MG EC tablet Take 1 tablet (75 mg total) by mouth 2 (two) times daily. 04/11/19  Yes Saguier, Ramon Dredge, PA-C  Diclofenac Sodium (PENNSAID) 2 % SOLN Place 1 application onto the skin 2 (two) times daily. 04/14/19  Yes Myra Rude, MD  esomeprazole (NEXIUM) 40 MG capsule Take 1 capsule (40 mg total) by mouth daily. 01/18/19  Yes [place], Betty G, MD  famotidine (PEPCID) 20 MG tablet 1-2 tab po q day 05/05/19  Yes Saguier, Ramon Dredge, PA-C  fluticasone [facility]) 50 MCG/ACT nasal spray Place 2 sprays into both nostrils daily. 10/31/19  Yes Saguier, Ramon Dredge, PA-C  levocetirizine (XYZAL) 5 MG tablet TAKE  ONE TABLET BY MOUTH EVERY EVENING 08/22/19  Yes Saguier, Ramon Dredge, PA-C  lidocaine (LIDODERM) 5 % Place 1 patch onto the skin every 12 (twelve) hours. Remove & Discard patch within 12 hours or as directed by MD 04/10/19 04/09/20 Yes Willy Eddy, MD  mometasone (NASONEX) 50 MCG/ACT nasal spray Place 2 sprays into the nose daily. 01/18/19  Yes [place], Betty G, MD  montelukast (SINGULAIR) 10 MG tablet Take 1 tablet (10 mg total) by mouth at bedtime. 04/19/19  Yes Saguier, Ramon Dredge, PA-C  predniSONE (DELTASONE) 10 MG tablet 4 tab po day 1, 3 tab po day 2, 2 tab po day 3, 1 tab po day 4 01/28/19  Yes Saguier, Ramon Dredge, PA-C  promethazine (PHENERGAN) 25 MG tablet Take 1 tablet (25 mg total) by mouth every 6 (six) hours as needed for nausea or vomiting. 10/29/19  Yes Bethel Born, PA-C  rosuvastatin (CRESTOR) 20 MG tablet Take 1 tablet (20 mg total) by mouth daily. 04/25/19 07/24/19  Antonieta Iba, MD     Positive ROS: Otherwise negative  All other systems have been reviewed and were otherwise negative with the exception of those mentioned in the HPI and as above.  Physical Exam: Constitutional: Alert, well-appearing, no acute distress Ears: External ears without lesions or tenderness. Ear canals are clear bilaterally with intact, clear TMs.  Nasal: External nose without lesions. Septum deviated to the left with mild rhinitis.. Clear nasal passages.  Both middle meatus regions are clear no polyps noted no drainage noted. Oral: Lips and gums without lesions. Tongue and palate mucosa without lesions. Posterior oropharynx clear. Neck: No palpable adenopathy or masses Respiratory: Breathing comfortably  Skin: No facial/neck lesions or rash noted.  Myringotomy  Date/Time: 01/20/2020 4:51 PM Performed by: Drema Halon, MD Authorized by: Drema Halon, MD   Consent:    Consent obtained:  Verbal   Consent given by:  Patient   Risks discussed:  Bleeding and pain   Alternatives  discussed:  No treatment Pre-procedure details:    Indications: serous otitis media   Anesthesia:    Anesthesia method:  Topical application   Topical anesthetic:  Phenol Procedure Details:    Location:  Left TM   Hole made in:  Anteroinferior aspect of TM   Tube:  Paparella Type 1 Findings:    Fluid:  No fluid Post-procedure details:    Patient tolerance of procedure:  Tolerated well, no immediate complications Comments:     Patient tolerated the left M&T well.  He felt like his symptoms were better after the procedure.    Assessment: Chronic left eustachian tube dysfunction. Septal  deviation Chronic rhinitis Mild sinus disease.  Plan: Recommended regular use of nasal steroid spray.  Could consider surgical intervention at a later date if indicated. Surgery would consist of septoplasty term reductions and limited FESS. Left M&T was performed in the office today using Paparella type I tube.   Radene Journey, MD

## 2020-02-03 ENCOUNTER — Encounter (INDEPENDENT_AMBULATORY_CARE_PROVIDER_SITE_OTHER): Payer: Federal, State, Local not specified - PPO | Admitting: Otolaryngology

## 2020-02-10 ENCOUNTER — Encounter (INDEPENDENT_AMBULATORY_CARE_PROVIDER_SITE_OTHER): Payer: Self-pay | Admitting: Otolaryngology

## 2020-02-10 ENCOUNTER — Other Ambulatory Visit: Payer: Self-pay

## 2020-02-10 ENCOUNTER — Ambulatory Visit (INDEPENDENT_AMBULATORY_CARE_PROVIDER_SITE_OTHER): Payer: Federal, State, Local not specified - PPO | Admitting: Otolaryngology

## 2020-02-10 VITALS — Temp 98.2°F

## 2020-02-10 DIAGNOSIS — H6982 Other specified disorders of Eustachian tube, left ear: Secondary | ICD-10-CM

## 2020-02-10 DIAGNOSIS — Z4889 Encounter for other specified surgical aftercare: Secondary | ICD-10-CM

## 2020-02-10 NOTE — Progress Notes (Addendum)
HPI: Mark Duncan is a 49 y.o. male who returns today for evaluation of left ear complaints.  We have placed a left myringotomy tube with a palpable type I tube performed 3 weeks ago.  He has had no drainage from the ear but complains of a lot of noise and popping in the ear and feels like fluid is in the ear.  I reviewed his previous CT scan of his sinuses that showed no middle ear fluid or mastoid fluid.  He has had no drainage from the ear.  However when he lays down at night to sleep he feels a lot of noise and popping in the ear that keeps him awake.  He apparently has had this on and off for years.  Past Medical History:  Diagnosis Date  . Blood transfusion without reported diagnosis   . GERD (gastroesophageal reflux disease)   . Hyperlipidemia    Past Surgical History:  Procedure Laterality Date  . HAND SURGERY     Glass removed  . KNEE ARTHROSCOPY W/ MENISCAL REPAIR     Social History   Socioeconomic History  . Marital status: Married    Spouse name: Not on file  . Number of children: Not on file  . Years of education: Not on file  . Highest education level: Not on file  Occupational History  . Not on file  Tobacco Use  . Smoking status: Light Tobacco Smoker  . Smokeless tobacco: Former Engineer, water and Sexual Activity  . Alcohol use: No    Alcohol/week: 0.0 standard drinks  . Drug use: Not on file  . Sexual activity: Not on file  Other Topics Concern  . Not on file  Social History Narrative  . Not on file   Social Determinants of Health   Financial Resource Strain:   . Difficulty of Paying Living Expenses: Not on file  Food Insecurity:   . Worried About Programme researcher, broadcasting/film/video in the Last Year: Not on file  . Ran Out of Food in the Last Year: Not on file  Transportation Needs:   . Lack of Transportation (Medical): Not on file  . Lack of Transportation (Non-Medical): Not on file  Physical Activity:   . Days of Exercise per Week: Not on file  . Minutes of  Exercise per Session: Not on file  Stress:   . Feeling of Stress : Not on file  Social Connections:   . Frequency of Communication with Friends and Family: Not on file  . Frequency of Social Gatherings with Friends and Family: Not on file  . Attends Religious Services: Not on file  . Active Member of Clubs or Organizations: Not on file  . Attends Banker Meetings: Not on file  . Marital Status: Not on file   Family History  Problem Relation Age of Onset  . Heart disease Maternal Uncle   . Heart disease Maternal Grandmother    Allergies  Allergen Reactions  . Fish Allergy Hives, Itching and Swelling    Grouper Only   Prior to Admission medications   Medication Sig Start Date End Date Taking? Authorizing Provider  azelastine (ASTELIN) 0.1 % nasal spray Place 2 sprays into both nostrils 2 (two) times daily. Use in each nostril as directed 10/31/19  Yes Saguier, Ramon Dredge, PA-C  azithromycin (ZITHROMAX) 250 MG tablet Take 2 tablets by mouth on day 1, followed by 1 tablet by mouth daily for 4 days. 10/31/19  Yes Saguier, Ramon Dredge, PA-C  diclofenac (  VOLTAREN) 75 MG EC tablet Take 1 tablet (75 mg total) by mouth 2 (two) times daily. 04/11/19  Yes Saguier, Percell Miller, PA-C  Diclofenac Sodium (PENNSAID) 2 % SOLN Place 1 application onto the skin 2 (two) times daily. 04/14/19  Yes Rosemarie Ax, MD  esomeprazole (NEXIUM) 40 MG capsule Take 1 capsule (40 mg total) by mouth daily. 01/18/19  Yes [place], Betty G, MD  famotidine (PEPCID) 20 MG tablet 1-2 tab po q day 05/05/19  Yes Saguier, Percell Miller, PA-C  fluticasone [facility]) 50 MCG/ACT nasal spray Place 2 sprays into both nostrils daily. 10/31/19  Yes Saguier, Percell Miller, PA-C  levocetirizine (XYZAL) 5 MG tablet TAKE ONE TABLET BY MOUTH EVERY EVENING 08/22/19  Yes Saguier, Percell Miller, PA-C  lidocaine (LIDODERM) 5 % Place 1 patch onto the skin every 12 (twelve) hours. Remove & Discard patch within 12 hours or as directed by MD 04/10/19 04/09/20 Yes Merlyn Lot, MD  mometasone (NASONEX) 50 MCG/ACT nasal spray Place 2 sprays into the nose daily. 01/18/19  Yes [place], Betty G, MD  montelukast (SINGULAIR) 10 MG tablet Take 1 tablet (10 mg total) by mouth at bedtime. 04/19/19  Yes Saguier, Percell Miller, PA-C  predniSONE (DELTASONE) 10 MG tablet 4 tab po day 1, 3 tab po day 2, 2 tab po day 3, 1 tab po day 4 01/28/19  Yes Saguier, Percell Miller, PA-C  promethazine (PHENERGAN) 25 MG tablet Take 1 tablet (25 mg total) by mouth every 6 (six) hours as needed for nausea or vomiting. 10/29/19  Yes Recardo Evangelist, PA-C  rosuvastatin (CRESTOR) 20 MG tablet Take 1 tablet (20 mg total) by mouth daily. 04/25/19 07/24/19  Minna Merritts, MD     Positive ROS: Otherwise negative  All other systems have been reviewed and were otherwise negative with the exception of those mentioned in the HPI and as above.  Physical Exam: Constitutional: Alert, well-appearing, no acute distress Ears: External ears without lesions or tenderness.  Left ear canal is clear.  A Paparella type I myringotomy tube is sitting in the TM anteriorly inferiorly and is patent and dry.  The middle ear space was clear with no middle ear space abnormality noted.. Nasal: External nose without lesions. Septum deviated to the left.  Mild rhinitis.. Neck: No palpable adenopathy or masses Respiratory: Breathing comfortably  Skin: No facial/neck lesions or rash noted.  Procedures  Assessment: Status post placement of left M&T 3 weeks ago.  Still with persistent symptoms of sensation of fluid behind the ear.. Perhaps chronic eustachian tube dysfunction versus related to sinuses versus related to cochlear or inner ear problems.  Plan: Reviewed with patient today concerning normal examination with a clear TM and patent myringotomy tube and no middle ear fluid noted on clinical exam. Suggested obtaining audiogram to further evaluate hearing but patient was opposed to having any further testing. Recommended  continue use of nasal steroid spray and allergy medication and limit decongestant use which he uses fairly regularly.  Also recommended keeping all water and liquids out of the right ear while he has a tube in place. He became upset when I discussed with him that I could find nothing abnormal on clinical exam or review of his CT scan. Suggested follow-up with another physician for another opinion. He has already seen one other otolaryngologist. He walked out of the office very upset.   Radene Journey, MD

## 2020-03-28 DIAGNOSIS — H938X2 Other specified disorders of left ear: Secondary | ICD-10-CM | POA: Diagnosis not present

## 2020-03-28 DIAGNOSIS — H93A2 Pulsatile tinnitus, left ear: Secondary | ICD-10-CM | POA: Diagnosis not present

## 2020-03-28 DIAGNOSIS — H9190 Unspecified hearing loss, unspecified ear: Secondary | ICD-10-CM | POA: Diagnosis not present

## 2020-04-04 DIAGNOSIS — H93A2 Pulsatile tinnitus, left ear: Secondary | ICD-10-CM | POA: Diagnosis not present

## 2020-04-04 DIAGNOSIS — I2699 Other pulmonary embolism without acute cor pulmonale: Secondary | ICD-10-CM | POA: Diagnosis not present

## 2020-04-06 ENCOUNTER — Encounter: Payer: Federal, State, Local not specified - PPO | Admitting: Medical

## 2020-04-06 DIAGNOSIS — Z0289 Encounter for other administrative examinations: Secondary | ICD-10-CM

## 2020-04-07 DIAGNOSIS — A084 Viral intestinal infection, unspecified: Secondary | ICD-10-CM | POA: Diagnosis not present

## 2020-05-12 ENCOUNTER — Emergency Department (HOSPITAL_BASED_OUTPATIENT_CLINIC_OR_DEPARTMENT_OTHER): Payer: Federal, State, Local not specified - PPO

## 2020-05-12 ENCOUNTER — Encounter (HOSPITAL_BASED_OUTPATIENT_CLINIC_OR_DEPARTMENT_OTHER): Payer: Self-pay | Admitting: Emergency Medicine

## 2020-05-12 ENCOUNTER — Emergency Department (HOSPITAL_BASED_OUTPATIENT_CLINIC_OR_DEPARTMENT_OTHER)
Admission: EM | Admit: 2020-05-12 | Discharge: 2020-05-12 | Disposition: A | Discharge status: Home / Self Care | Payer: Federal, State, Local not specified - PPO | Attending: Emergency Medicine | Admitting: Emergency Medicine

## 2020-05-12 ENCOUNTER — Other Ambulatory Visit: Payer: Self-pay

## 2020-05-12 DIAGNOSIS — I2584 Coronary atherosclerosis due to calcified coronary lesion: Secondary | ICD-10-CM | POA: Insufficient documentation

## 2020-05-12 DIAGNOSIS — K6289 Other specified diseases of anus and rectum: Secondary | ICD-10-CM

## 2020-05-12 DIAGNOSIS — Z91013 Allergy to seafood: Secondary | ICD-10-CM | POA: Diagnosis not present

## 2020-05-12 DIAGNOSIS — E785 Hyperlipidemia, unspecified: Secondary | ICD-10-CM | POA: Insufficient documentation

## 2020-05-12 DIAGNOSIS — Z720 Tobacco use: Secondary | ICD-10-CM | POA: Diagnosis not present

## 2020-05-12 DIAGNOSIS — R9389 Abnormal findings on diagnostic imaging of other specified body structures: Secondary | ICD-10-CM | POA: Insufficient documentation

## 2020-05-12 DIAGNOSIS — K610 Anal abscess: Secondary | ICD-10-CM | POA: Insufficient documentation

## 2020-05-12 DIAGNOSIS — Z79899 Other long term (current) drug therapy: Secondary | ICD-10-CM | POA: Insufficient documentation

## 2020-05-12 DIAGNOSIS — I2510 Atherosclerotic heart disease of native coronary artery without angina pectoris: Secondary | ICD-10-CM | POA: Insufficient documentation

## 2020-05-12 LAB — CBC WITH DIFFERENTIAL/PLATELET
Abs Immature Granulocytes: 0.04 10*3/uL (ref 0.00–0.07)
Basophils Absolute: 0.1 10*3/uL (ref 0.0–0.1)
Basophils Relative: 0 %
Eosinophils Absolute: 0.2 10*3/uL (ref 0.0–0.5)
Eosinophils Relative: 1 %
HCT: 47.5 % (ref 39.0–52.0)
Hemoglobin: 15.7 g/dL (ref 13.0–17.0)
Immature Granulocytes: 0 %
Lymphocytes Relative: 14 %
Lymphs Abs: 2 10*3/uL (ref 0.7–4.0)
MCH: 31.5 pg (ref 26.0–34.0)
MCHC: 33.1 g/dL (ref 30.0–36.0)
MCV: 95.2 fL (ref 80.0–100.0)
Monocytes Absolute: 1.1 10*3/uL — ABNORMAL HIGH (ref 0.1–1.0)
Monocytes Relative: 8 %
Neutro Abs: 11.1 10*3/uL — ABNORMAL HIGH (ref 1.7–7.7)
Neutrophils Relative %: 77 %
Platelets: 230 10*3/uL (ref 150–400)
RBC: 4.99 MIL/uL (ref 4.22–5.81)
RDW: 12.5 % (ref 11.5–15.5)
WBC: 14.4 10*3/uL — ABNORMAL HIGH (ref 4.0–10.5)
nRBC: 0 % (ref 0.0–0.2)

## 2020-05-12 LAB — BASIC METABOLIC PANEL
Anion gap: 9 (ref 5–15)
BUN: 11 mg/dL (ref 6–20)
CO2: 24 mmol/L (ref 22–32)
Calcium: 9 mg/dL (ref 8.9–10.3)
Chloride: 106 mmol/L (ref 98–111)
Creatinine, Ser: 0.83 mg/dL (ref 0.61–1.24)
GFR calc Af Amer: >60 mL/min (ref >60)
GFR calc non Af Amer: >60 mL/min (ref >60)
Glucose, Bld: 110 mg/dL — ABNORMAL HIGH (ref 70–99)
Potassium: 4 mmol/L (ref 3.5–5.1)
Sodium: 139 mmol/L (ref 135–145)

## 2020-05-12 MED ORDER — MORPHINE SULFATE (PF) 4 MG/ML IV SOLN
4.0000 mg | Freq: Once | INTRAVENOUS | Status: AC
  Administered 2020-05-12: 4 mg via INTRAVENOUS
  Filled 2020-05-12: qty 1

## 2020-05-12 MED ORDER — OXYCODONE-ACETAMINOPHEN 5-325 MG PO TABS
1.0000 | ORAL_TABLET | Freq: Once | ORAL | Status: AC
  Administered 2020-05-12: 1 via ORAL
  Filled 2020-05-12: qty 1

## 2020-05-12 MED ORDER — OXYCODONE-ACETAMINOPHEN 5-325 MG PO TABS: 1.0000 | ORAL_TABLET | Freq: Four times a day (QID) | ORAL | 0 refills | Status: AC | PRN

## 2020-05-12 MED ORDER — GADOBUTROL 1 MMOL/ML IV SOLN
7.5000 mL | Freq: Once | INTRAVENOUS | Status: AC | PRN
  Administered 2020-05-12: 7.5 mL via INTRAVENOUS

## 2020-05-12 MED ORDER — AMOXICILLIN-POT CLAVULANATE 875-125 MG PO TABS: 1.0000 | ORAL_TABLET | Freq: Two times a day (BID) | ORAL | 0 refills | Status: AC

## 2020-05-12 NOTE — ED Triage Notes (Signed)
Pt is c/o rectal pain  Pt states the pain is only on the left side  Pt states the pain is sharp and continuous   Pt states he has tried ibuprofen and preparation H without relief   Pt states he felt around the area and it feels like he has a knot in that area  Denies any rectal bleeding

## 2020-05-12 NOTE — Discharge Instructions (Addendum)
Take the antibiotic as prescribed.  Recommend taking MiraLAX daily to help prevent constipation.  Recommend Tylenol Motrin for pain control.  For breakthrough pain, can take the prescribed Percocet.  If you develop worsening pain, any fever or other new concerning symptom, recommend return to ER for reassessment.  Please follow up with general surgery this coming week.

## 2020-05-12 NOTE — ED Notes (Signed)
ED Provider at bedside. Provided results of examinations and discuss plan of care

## 2020-05-12 NOTE — ED Provider Notes (Addendum)
[facility] EMERGENCY DEPARTMENT Provider Note   CSN: [id number] Arrival date & time: 05/12/20  0342   History Chief Complaint  Patient presents with  . Rectal Pain    Mark Duncan is a 49 y.o. male.  The history is provided by the patient.  He has history of hyperlipidemia and comes in complaining of pain in the left buttock over the last 2 days.  Pain is sharp and severe and he rates it at 9/10.  Is worse with sitting and standing and walking, better when he is in the shower and hot water is running over it.  He has noted hard knot on the left side near the anus.  He denies fever, chills, sweats.  There is no history of any thing similar in the past.  Past Medical History:  Diagnosis Date  . Blood transfusion without reported diagnosis   . GERD (gastroesophageal reflux disease)   . Hyperlipidemia     Patient Active Problem List   Diagnosis Date Noted  . Coronary artery calcification of native artery 04/25/2019  . Mixed hyperlipidemia 04/25/2019  . Atypical chest pain 05/24/2015  . Rib pain on left side 05/24/2015  . GERD (gastroesophageal reflux disease) 05/24/2015  . History of bloody stools 05/24/2015  . Smoker 05/24/2015    Past Surgical History:  Procedure Laterality Date  . HAND SURGERY     Glass removed  . KNEE ARTHROSCOPY W/ MENISCAL REPAIR         Family History  Problem Relation Age of Onset  . Heart disease Maternal Uncle   . Heart disease Maternal Grandmother     Social History   Tobacco Use  . Smoking status: Light Tobacco Smoker  . Smokeless tobacco: Former Engineer, water Use Topics  . Alcohol use: No    Alcohol/week: 0.0 standard drinks  . Drug use: Never    Home Medications Prior to Admission medications   Medication Sig Start Date End Date Taking? Authorizing Provider  azelastine (ASTELIN) 0.1 % nasal spray Place 2 sprays into both nostrils 2 (two) times daily. Use in each nostril as directed 10/31/19   Saguier, Ramon Dredge,  PA-C  azithromycin (ZITHROMAX) 250 MG tablet Take 2 tablets by mouth on day 1, followed by 1 tablet by mouth daily for 4 days. 10/31/19   Saguier, Ramon Dredge, PA-C  diclofenac (VOLTAREN) 75 MG EC tablet Take 1 tablet (75 mg total) by mouth 2 (two) times daily. 04/11/19   Saguier, Ramon Dredge, PA-C  Diclofenac Sodium (PENNSAID) 2 % SOLN Place 1 application onto the skin 2 (two) times daily. 04/14/19   Myra Rude, MD  esomeprazole (NEXIUM) 40 MG capsule Take 1 capsule (40 mg total) by mouth daily. 01/18/19   [place], Betty G, MD  famotidine (PEPCID) 20 MG tablet 1-2 tab po q day 05/05/19   Saguier, Ramon Dredge, PA-C  fluticasone [facility]) 50 MCG/ACT nasal spray Place 2 sprays into both nostrils daily. 10/31/19   Saguier, Ramon Dredge, PA-C  levocetirizine (XYZAL) 5 MG tablet TAKE ONE TABLET BY MOUTH EVERY EVENING 08/22/19   Saguier, Ramon Dredge, PA-C  mometasone (NASONEX) 50 MCG/ACT nasal spray Place 2 sprays into the nose daily. 01/18/19   [place], Betty G, MD  montelukast (SINGULAIR) 10 MG tablet Take 1 tablet (10 mg total) by mouth at bedtime. 04/19/19   Saguier, Ramon Dredge, PA-C  predniSONE (DELTASONE) 10 MG tablet 4 tab po day 1, 3 tab po day 2, 2 tab po day 3, 1 tab po day 4 01/28/19  Saguier, Percell Miller, PA-C  promethazine (PHENERGAN) 25 MG tablet Take 1 tablet (25 mg total) by mouth every 6 (six) hours as needed for nausea or vomiting. 10/29/19   Recardo Evangelist, PA-C  rosuvastatin (CRESTOR) 20 MG tablet Take 1 tablet (20 mg total) by mouth daily. 04/25/19 07/24/19  Minna Merritts, MD    Allergies    Fish allergy  Review of Systems   Review of Systems  All other systems reviewed and are negative.   Physical Exam Updated Vital Signs BP 124/88 (BP Location: Right Arm)   Pulse 74   Temp 98 F (36.7 C) (Oral)   Resp 16   Ht 6\' 1"  (1.854 m)   Wt 88 kg   SpO2 97%   BMI 25.60 kg/m   Physical Exam Vitals and nursing note reviewed.   49 year old male, resting comfortably and in no acute distress. Vital signs are  normal. Oxygen saturation is 97%, which is normal. Head is normocephalic and atraumatic. PERRLA, EOMI. Oropharynx is clear. Neck is nontender and supple without adenopathy or JVD. Back is nontender and there is no CVA tenderness. Lungs are clear without rales, wheezes, or rhonchi. Chest is nontender. Heart has regular rate and rhythm without murmur. Abdomen is soft, flat, nontender without masses or hepatosplenomegaly and peristalsis is normoactive. Rectal: Mild induration noted on the left perirectal area, but no swelling or fluctuance.  Papular rash is noted in the perianal area and is worse on the left. Extremities have no cyanosis or edema, full range of motion is present. Skin is warm and dry without rash. Neurologic: Mental status is normal, cranial nerves are intact, there are no motor or sensory deficits.  ED Results / Procedures / Treatments   Labs (all labs ordered are listed, but only abnormal results are displayed) Labs Reviewed  BASIC METABOLIC PANEL - Abnormal; Notable for the following components:      Result Value   Glucose, Bld 110 (*)    All other components within normal limits  CBC WITH DIFFERENTIAL/PLATELET - Abnormal; Notable for the following components:   WBC 14.4 (*)    Neutro Abs 11.1 (*)    Monocytes Absolute 1.1 (*)    All other components within normal limits    Radiology CT PELVIS WO CONTRAST  Result Date: 05/12/2020 CLINICAL DATA:  Per ED notes: Pt is c/o rectal pain Pt states the pain is only on the left side Pt states the pain is sharp and continuous Pt states he has tried ibuprofen and preparation H without relief Pt states he felt around the area and it feels like he has a knot in that area Denies any rectal bleeding. EXAM: CT PELVIS WITHOUT CONTRAST TECHNIQUE: Multidetector CT imaging of the pelvis was performed following the standard protocol without intravenous contrast. COMPARISON:  None. FINDINGS: Urinary Tract: Bladder and visualized distal  ureters are unremarkable. Bowel:  Unremarkable visualized pelvic bowel loops. Vascular/Lymphatic: Aortoiliac atherosclerotic calcifications. No aneurysm. No enlarged lymph nodes. Reproductive:  Unremarkable. Other: There is a mass along the left aspect the anus, measuring approximately 4.1 x 3.4 x 3.6 cm. Mass is intermediate in attenuation, average Hounsfield units of 35. Subtle inflammation is noted along the adjacent left perineal subcutaneous fat. Musculoskeletal: No fracture or acute finding.  No bone lesion. IMPRESSION: 1. Indeterminate mass along the left perineum, adjacent to the left aspect of the anus, measuring 4.1 cm in greatest dimension. This could reflect a complicated cyst. There is minimal adjacent subcutaneous fat inflammation. An  abscess is possible, but felt less likely due to the limited degree of adjacent inflammation. Finding could be further assessed, if desired clinically, with pelvic MRI without and with contrast. Electronically Signed   By: Amie Portland M.D.   On: 05/12/2020 06:32    Procedures Procedures   Medications Ordered in ED Medications  morphine 4 MG/ML injection 4 mg (4 mg Intravenous Given 05/12/20 0449)  morphine 4 MG/ML injection 4 mg (4 mg Intravenous Given 05/12/20 7673)    ED Course  I have reviewed the triage vital signs and the nursing notes.  Pertinent labs & imaging results that were available during my care of the patient were reviewed by me and considered in my medical decision making (see chart for details).  MDM Rules/Calculators/A&P Perianal pain of uncertain cause.  Will send for CT to evaluate for possible abscess.  Old records reviewed, and he has no relevant past visits.  Labs show mild leukocytosis and are otherwise unremarkable.  CT does show a 4.1 x 3.4 x 3.6 cm perirectal mass which may be a complex cyst versus abscess.  Recommendation is for MRI to further characterize it.  This is ordered.  Case is signed out to Dr.Dykstra.  Depending on  findings, may need surgical consultation.  Final Clinical Impression(s) / ED Diagnoses Final diagnoses:  Mass of perirectal soft tissue    Rx / DC Orders ED Discharge Orders    None       Dione Booze, MD 05/12/20 0451    Dione Booze, MD 05/12/20 [phone]

## 2020-05-14 DIAGNOSIS — K610 Anal abscess: Secondary | ICD-10-CM | POA: Diagnosis not present

## 2020-06-21 ENCOUNTER — Ambulatory Visit: Payer: Federal, State, Local not specified - PPO | Admitting: Medical

## 2020-06-21 ENCOUNTER — Telehealth: Payer: Self-pay

## 2020-06-21 ENCOUNTER — Encounter: Payer: Self-pay | Admitting: Medical

## 2020-06-21 ENCOUNTER — Other Ambulatory Visit: Payer: Self-pay

## 2020-06-21 VITALS — BP 138/78 | HR 75 | Temp 98.1°F | Resp 18 | Ht 73.0 in | Wt 196.4 lb

## 2020-06-21 DIAGNOSIS — R197 Diarrhea, unspecified: Secondary | ICD-10-CM

## 2020-06-21 DIAGNOSIS — R110 Nausea: Secondary | ICD-10-CM | POA: Diagnosis not present

## 2020-06-21 LAB — CBC WITH DIFFERENTIAL/PLATELET
Basophils Absolute: 0.1 10*3/uL (ref 0.0–0.1)
Basophils Relative: 0.7 % (ref 0.0–3.0)
Eosinophils Absolute: 0.3 10*3/uL (ref 0.0–0.7)
Eosinophils Relative: 2.9 % (ref 0.0–5.0)
HCT: 49.6 % (ref 39.0–52.0)
Hemoglobin: 16.7 g/dL (ref 13.0–17.0)
Lymphocytes Relative: 32.3 % (ref 12.0–46.0)
Lymphs Abs: 3.2 10*3/uL (ref 0.7–4.0)
MCHC: 33.7 g/dL (ref 30.0–36.0)
MCV: 96.6 fl (ref 78.0–100.0)
Monocytes Absolute: 1 10*3/uL (ref 0.1–1.0)
Monocytes Relative: 9.7 % (ref 3.0–12.0)
Neutro Abs: 5.4 10*3/uL (ref 1.4–7.7)
Neutrophils Relative %: 54.4 % (ref 43.0–77.0)
Platelets: 218 10*3/uL (ref 150.0–400.0)
RBC: 5.13 Mil/uL (ref 4.22–5.81)
RDW: 13.5 % (ref 11.5–15.5)
WBC: 10 10*3/uL (ref 4.0–10.5)

## 2020-06-21 LAB — COMPREHENSIVE METABOLIC PANEL
ALT: 17 U/L (ref 0–53)
AST: 15 U/L (ref 0–37)
Albumin: 4.9 g/dL (ref 3.5–5.2)
Alkaline Phosphatase: 72 U/L (ref 39–117)
BUN: 13 mg/dL (ref 6–23)
CO2: 27 mEq/L (ref 19–32)
Calcium: 9.9 mg/dL (ref 8.4–10.5)
Chloride: 102 mEq/L (ref 96–112)
Creatinine, Ser: 1.01 mg/dL (ref 0.40–1.50)
GFR: 78.99 mL/min (ref >60.00)
Glucose, Bld: 97 mg/dL (ref 70–99)
Potassium: 4.4 mEq/L (ref 3.5–5.1)
Sodium: 137 mEq/L (ref 135–145)
Total Bilirubin: 0.6 mg/dL (ref 0.2–1.2)
Total Protein: 7.2 g/dL (ref 6.0–8.3)

## 2020-06-21 MED ORDER — CIPROFLOXACIN HCL 250 MG PO TABS: 250.0000 mg | ORAL_TABLET | Freq: Two times a day (BID) | ORAL | 0 refills | Status: DC

## 2020-06-21 MED ORDER — ONDANSETRON 8 MG PO TBDP: 8.0000 mg | ORAL_TABLET | Freq: Three times a day (TID) | ORAL | 0 refills | Status: DC | PRN

## 2020-06-21 NOTE — Patient Instructions (Addendum)
You do have gi illness viral vs bacterial infection. Will rx zofran 8 mg dose and low dose 3 days cipro pending the results.   Studies to include c dif, o & p, shigella, salmonella, camplo bactera and e coli.  Hydrate well and bland diet. Immodium otc as instructed.  Follow up 7-10 days or as needed.

## 2020-06-21 NOTE — Telephone Encounter (Signed)
Patient assessed/treated by PCP today.   Mark Duncan TELEPHONE ADVICE RECORD [facility] Patient Name: Mark Duncan Gender: Male DOB: 09-20-1971 Age: 49 Y 6 M 20 D Return Phone Number: [phone] (Mark), [phone] (Secondary) Address: [place] [address] [place] [address] [facility] - [place] Duncan Duncan  Mark Duncan Physician Saguier, Ramon Dredge - [place] Contact Type Call Who Is Calling Patient / Member / Family / Caregiver Call Type Triage / Clinical Relationship To Patient Self Return Phone Number [phone] (Mark) Chief Complaint Abdominal Pain Reason for Call Symptomatic / Request for Health Information Initial Comment Pt may have a stomach bug - having stomach pain and nausea. Translation No Nurse Assessment Nurse: Micheline Maze, RN, Clydie Braun Date/Time Lamount Cohen Time): 06/20/2020 6:32:18 PM Confirm and document reason for call. If symptomatic, describe symptoms. ---Caller states that he woke up Sunday morning with mild nausea and poor PO - has had a lot of diarrhea for the past 3 days - now nausea and stomach cramps Has the patient had close contact with a person known or suspected to have the novel coronavirus illness OR traveled / lives in area with major community spread (including international travel) in the last 14 days from the onset of symptoms? * If Asymptomatic, screen for exposure and travel within the last 14 days. ---No Does the patient have any new or worsening symptoms? ---Yes Will a triage be completed? ---Yes Related visit to physician within the last 2 weeks? ---No Does the PT have any chronic conditions? (i.e. diabetes, asthma, this includes High risk factors for pregnancy, etc.) ---Yes List chronic conditions. ---Genella Rife Is this a behavioral health or substance abuse call? ---No Guidelines Guideline Title Affirmed Question Affirmed Notes Nurse Date/Time  (Eastern Time) Abdominal Pain - Male [1] MODERATE pain (e.g., interferes with normal activities) AND [2] pain comes and goes (cramps) AND [3] present > 24 hours (Exception: pain with Vomiting Micheline Maze RN, Clydie Braun 06/20/2020 6:35:49 PM PLEASE NOTE: All timestamps contained within this report are represented as [place] Standard Time. CONFIDENTIALTY NOTICE: This fax transmission is intended only for the addressee. It contains information that is legally privileged, confidential or otherwise protected from use or disclosure. If you are not the intended recipient, you are strictly prohibited from reviewing, disclosing, copying using or disseminating any of this information or taking any action in reliance on or regarding this information. If you have received this fax in error, please notify us immediately by telephone so that we can arrange for its return to [place]. Phone: [phone], Toll-Free: [phone], Fax: [phone] Page: 2 of 2 Call Id: [id number] Guidelines Guideline Title Affirmed Question Affirmed Notes Nurse Date/Time Lamount Cohen Time) or Diarrhea - see that Guideline) Disp. Time Lamount Cohen Time) Disposition Final User 06/20/2020 6:41:15 PM See PCP within 24 Hours Yes Micheline Maze, RN, Pablo Ledger Disagree/Comply Comply Caller Understands Yes PreDisposition Call Doctor Care Advice Given Per Guideline SEE PCP WITHIN 24 HOURS: * IF OFFICE WILL BE OPEN: You need to be examined within the next 24 hours. Call your doctor (or NP/PA) when the office opens and make an appointment. NOTE TO TRIAGER: * Use nurse judgment to select the most appropriate source of care. * Consider both the urgency of the patient's symptoms AND what resources may be needed to evaluate and manage the patient. CRAMPS: * Your cramps may be due to an intestinal virus or from something that you ate. * During cramps, drink some water, then lie down and try to  find a comfortable position. DIET: * Drink adequate fluids. Eat a bland  diet. OTC MEDS - BISMUTH SUBSALICYLATE (E.G., KAOPECTATE, PEPTO-BISMOL): * Helps reduce abdominal cramping, diarrhea, and vomiting. * Adult dosage: two tablets or two tablespoons (30 ml) PO. Maximum of 8 doses in a 24 hour period. CALL BACK IF: * Severe pain lasts over 1 hour * Constant pain lasts over 2 hours * You become worse. CARE ADVICE given per Abdominal Pain, Male (Adult) guideline. Referrals REFERRED TO PCP OFFICE [facility] - ED

## 2020-06-21 NOTE — Progress Notes (Signed)
Subjective:    Patient ID: Mark Duncan, male    DOB: [birth year], 49 y.o.   MRN: [id number]  HPI  Pt in with about 4 days of diarrhea with nausea and vomiting. Pt states had larger watery stools. Going about twice a day. Pt states viral illness around May. Zofran helped. Resolved quickly.  Had last loose stool this morning. Past 4 days 2 large water/loose stools a day.   Pt states his recent signs and symptoms occurred after eatring cheese steak sub from [employer]. He states ate sub at 5pm and next morning symptoms started.  About one month ago had perirectal abscess. He got area drained and was given antibiotics.  Pt states no diarrhea until just recently.  Pt notes that on web he notes various  [employer] locations across [place] may have caused food poising per pt report and web. He showed me various websites. Many  persons ate [place] cheese steaks and other items. Pt thinks this is where he got illness.   Review of Systems  Constitutional: Negative for chills, fatigue and fever.  Respiratory: Negative for cough, chest tightness, shortness of breath and wheezing.   Cardiovascular: Negative for chest pain and palpitations.  Gastrointestinal: Positive for diarrhea and nausea. Negative for abdominal distention, abdominal pain, constipation and vomiting.  Genitourinary: Negative for dysuria.  Musculoskeletal: Negative for back pain and myalgias.  Skin: Negative for rash.  Neurological: Negative for dizziness and headaches.  Hematological: Negative for adenopathy. Does not bruise/bleed easily.  Psychiatric/Behavioral: Negative for behavioral problems and decreased concentration.    Past Medical History:  Diagnosis Date  . Blood transfusion without reported diagnosis   . GERD (gastroesophageal reflux disease)   . Hyperlipidemia      Social History   Socioeconomic History  . Marital status: Married    Spouse name: Not on file  . Number of children: Not on file  . Years of  education: Not on file  . Highest education level: Not on file  Occupational History  . Not on file  Tobacco Use  . Smoking status: Light Tobacco Smoker  . Smokeless tobacco: Former Clinical biochemist  . Vaping Use: Never used  Substance and Sexual Activity  . Alcohol use: No    Alcohol/week: 0.0 standard drinks  . Drug use: Never  . Sexual activity: Not on file  Other Topics Concern  . Not on file  Social History Narrative  . Not on file   Social Determinants of Health   Financial Resource Strain:   . Difficulty of Paying Living Expenses:   Food Insecurity:   . Worried About Programme researcher, broadcasting/film/video in the Last Year:   . Barista in the Last Year:   Transportation Needs:   . Freight forwarder (Medical):   Marland Kitchen Lack of Transportation (Non-Medical):   Physical Activity:   . Days of Exercise per Week:   . Minutes of Exercise per Session:   Stress:   . Feeling of Stress :   Social Connections:   . Frequency of Communication with Friends and Family:   . Frequency of Social Gatherings with Friends and Family:   . Attends Religious Services:   . Active Member of Clubs or Organizations:   . Attends Banker Meetings:   Marland Kitchen Marital Status:   Intimate Partner Violence:   . Fear of Current or Ex-Partner:   . Emotionally Abused:   Marland Kitchen Physically Abused:   . Sexually Abused:  Past Surgical History:  Procedure Laterality Date  . HAND SURGERY     Glass removed  . KNEE ARTHROSCOPY W/ MENISCAL REPAIR      Family History  Problem Relation Age of Onset  . Heart disease Maternal Uncle   . Heart disease Maternal Grandmother     Allergies  Allergen Reactions  . Fish Allergy Hives, Itching and Swelling    Grouper Only    Current Outpatient Medications on File Prior to Visit  Medication Sig Dispense Refill  . azelastine (ASTELIN) 0.1 % nasal spray Place 2 sprays into both nostrils 2 (two) times daily. Use in each nostril as directed (Patient not taking:  Reported on 06/21/2020) 30 mL 3  . azithromycin (ZITHROMAX) 250 MG tablet Take 2 tablets by mouth on day 1, followed by 1 tablet by mouth daily for 4 days. (Patient not taking: Reported on 06/21/2020) 6 tablet 0  . diclofenac (VOLTAREN) 75 MG EC tablet Take 1 tablet (75 mg total) by mouth 2 (two) times daily. (Patient not taking: Reported on 06/21/2020) 30 tablet 0  . Diclofenac Sodium (PENNSAID) 2 % SOLN Place 1 application onto the skin 2 (two) times daily. (Patient not taking: Reported on 06/21/2020) 1 Bottle 2  . esomeprazole (NEXIUM) 40 MG capsule Take 1 capsule (40 mg total) by mouth daily. (Patient not taking: Reported on 06/21/2020) 30 capsule 1  . famotidine (PEPCID) 20 MG tablet 1-2 tab po q day 60 tablet 5  . fluticasone (FLONASE) 50 MCG/ACT nasal spray Place 2 sprays into both nostrils daily. (Patient not taking: Reported on 06/21/2020) 16 g 3  . levocetirizine (XYZAL) 5 MG tablet TAKE ONE TABLET BY MOUTH EVERY EVENING (Patient not taking: Reported on 06/21/2020) 30 tablet 0  . mometasone (NASONEX) 50 MCG/ACT nasal spray Place 2 sprays into the nose daily. (Patient not taking: Reported on 06/21/2020) 17 g 1  . montelukast (SINGULAIR) 10 MG tablet Take 1 tablet (10 mg total) by mouth at bedtime. (Patient not taking: Reported on 06/21/2020) 30 tablet 3  . predniSONE (DELTASONE) 10 MG tablet 4 tab po day 1, 3 tab po day 2, 2 tab po day 3, 1 tab po day 4 (Patient not taking: Reported on 06/21/2020) 10 tablet 0  . promethazine (PHENERGAN) 25 MG tablet Take 1 tablet (25 mg total) by mouth every 6 (six) hours as needed for nausea or vomiting. (Patient not taking: Reported on 06/21/2020) 10 tablet 0  . rosuvastatin (CRESTOR) 20 MG tablet Take 1 tablet (20 mg total) by mouth daily. 90 tablet 3   No current facility-administered medications on file prior to visit.    BP 138/78 (BP Location: Left Arm, Patient Position: Sitting, Cuff Size: Large)   Pulse 75   Temp 98.1 F (36.7 C) (Oral)   Resp 18   Ht 6'  1" (1.854 m)   Wt 196 lb 6.4 oz (89.1 kg)   SpO2 98%   BMI 25.91 kg/m       Objective:   Physical Exam  General- No acute distress. Pleasant patient. Neck- Full range of motion, no jvd Lungs- Clear, even and unlabored. Heart- regular rate and rhythm. Neurologic- CNII- XII grossly intact.  Abdomen- soft, nt, nd, +bs, no rebound, no rebound or guarding. Back- no cva tenderness.      Assessment & Plan:  You do have gi illness viral vs bacterial infection. Will rx zofran 8 mg dose and low dose 3 days cipro pending the results.   Studies to include c  dif, o & p, shigella, salmonella, camplo bactera and e coli.  Hydrate well and bland diet. Use immodium as instructed.  Follow up 7-10 days or as needed.   Time spent with patient today was 40   minutes which consisted of chart review, discussing diagnosis, work up, answering question,  treatment and documentation.

## 2020-06-22 ENCOUNTER — Other Ambulatory Visit: Payer: Federal, State, Local not specified - PPO

## 2020-06-22 DIAGNOSIS — R197 Diarrhea, unspecified: Secondary | ICD-10-CM

## 2020-06-22 NOTE — Addendum Note (Signed)
Addended by: Harley Alto on: 06/22/2020 03:00 PM   Modules accepted: Orders

## 2020-06-24 IMAGING — MR MR PELVIS WO/W CM
5 of 8 series · 30 of 48 positions shown · IV contrast (gadavist)
Comparison: CT scan 05/12/2020

CLINICAL DATA: Anorectal pain

EXAM:
MRI PELVIS WITHOUT AND WITH CONTRAST
TECHNIQUE: Multiplanar multisequence MR imaging of the pelvis was performed
both before and after administration of intravenous contrast.
CONTRAST:  7.5mL GADAVIST GADOBUTROL 1 MMOL/ML IV SOLN

[Series 2: T2 · sagittal · 4.0mm · 0.88mm/px · 7 of 30 slices shown (1 of 2)]
[im 1/30]
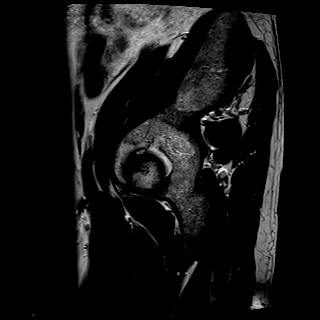
[im 5/30]
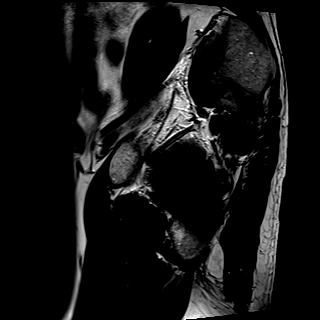
[im 10/30]
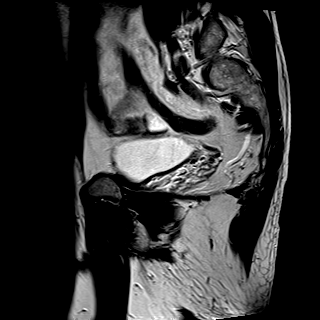
[im 15/30]
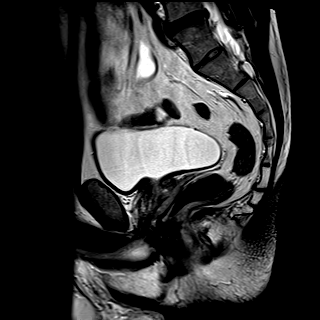
[im 20/30]
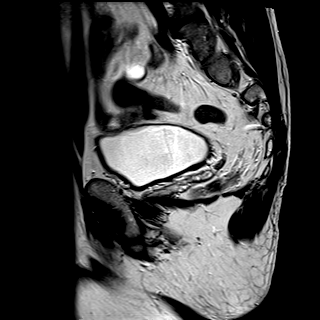
[im 25/30]
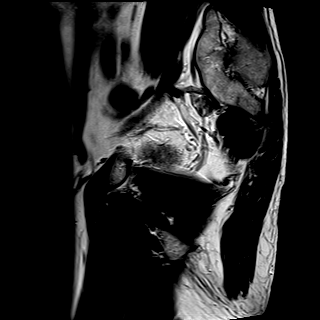
[im 30/30]
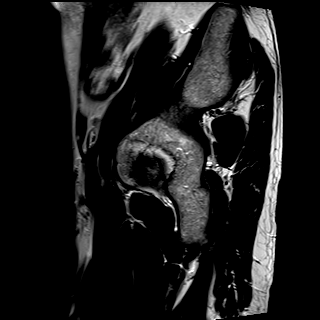

[Series 3: T2 fat-sat · sagittal · 4.0mm · 0.83mm/px · 7 of 30 slices shown (1 of 3)]
[im 1/30]
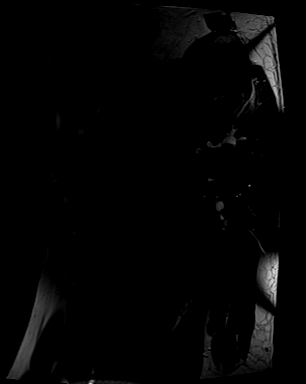
[im 5/30]
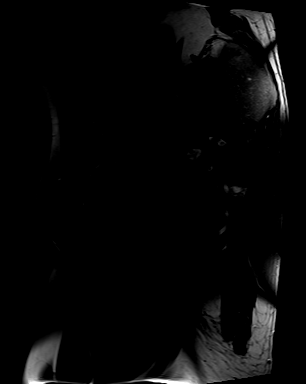
[im 10/30]
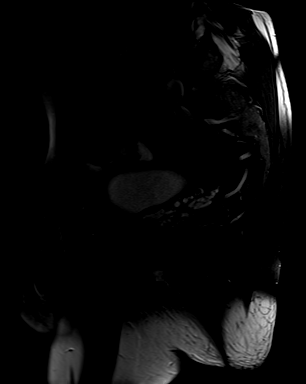
[im 15/30]
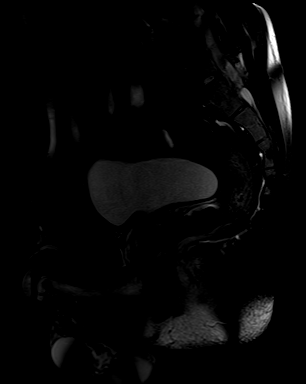
[im 20/30]
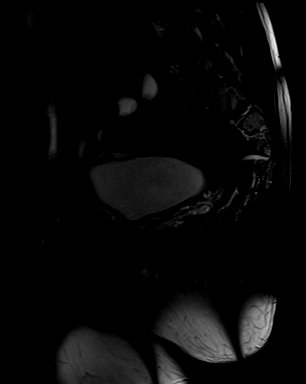
[im 25/30]
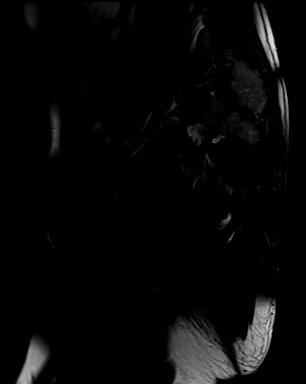
[im 30/30]
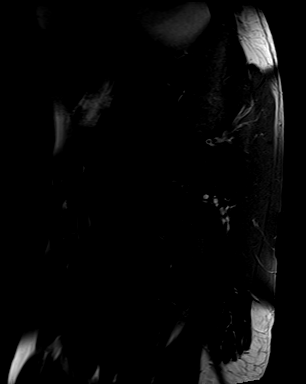

[Series 5: T2 · axial · 4.0mm · 0.34mm/px · z∈[-171,-41]mm · 6 of 28 slices shown (2 of 2)]
[im 1/28]
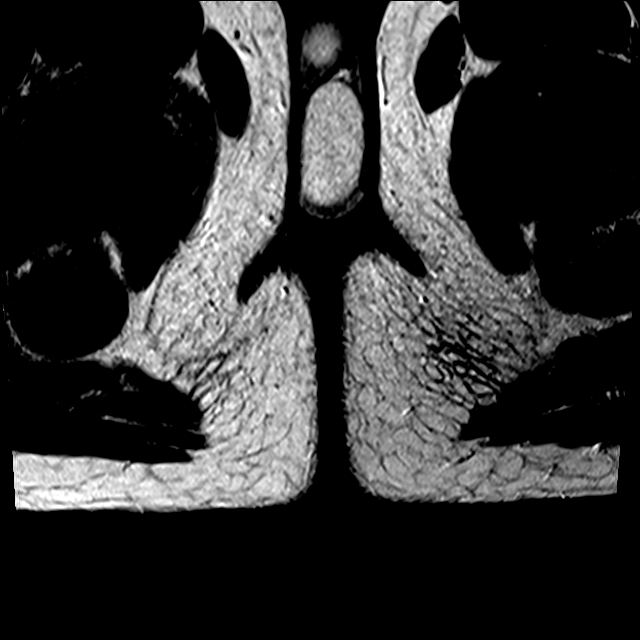
[im 6/28]
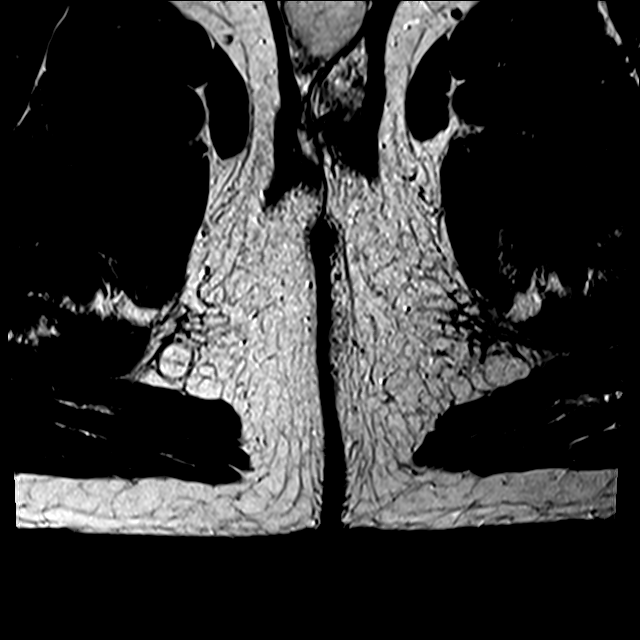
[im 11/28]
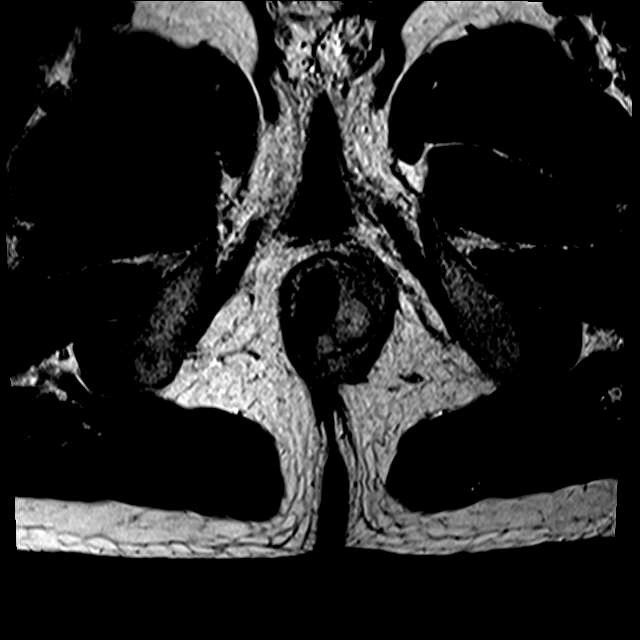
[im 17/28]
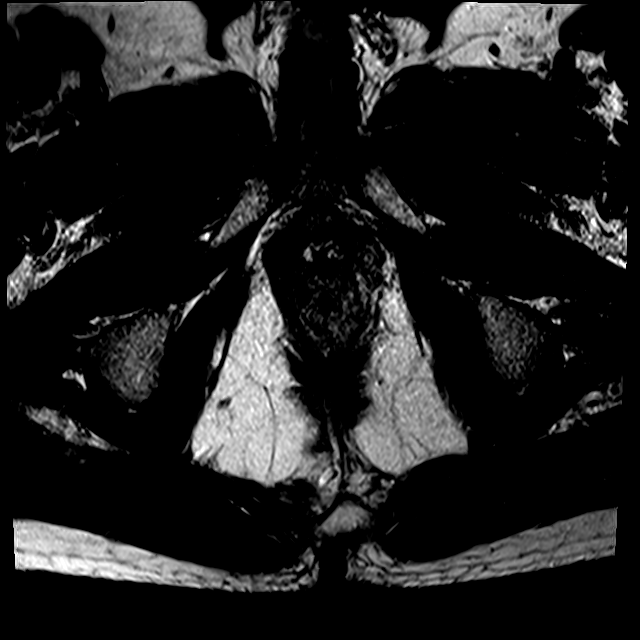
[im 22/28]
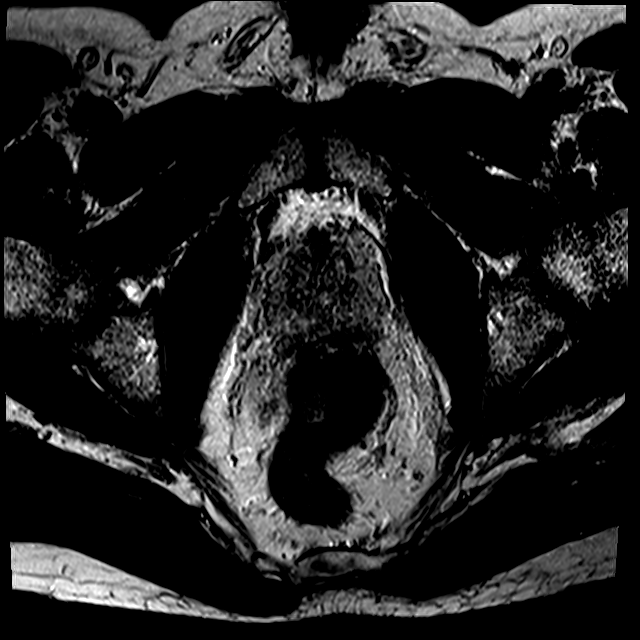
[im 28/28]
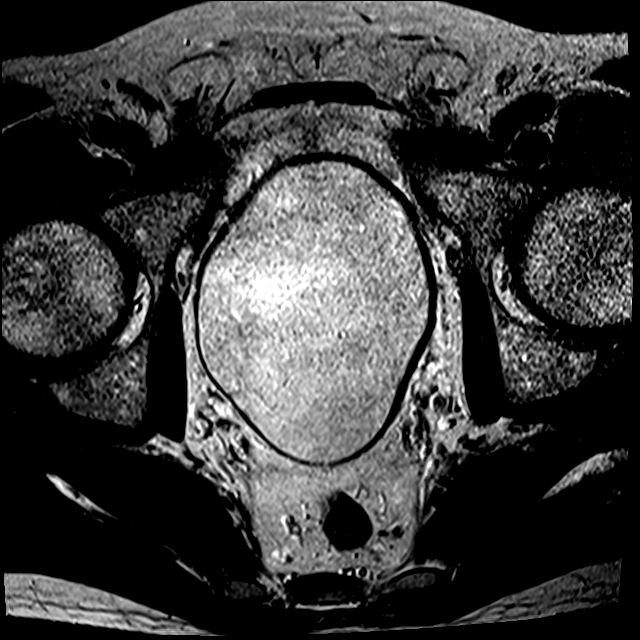

[Series 6: T2 fat-sat · axial · 5.5mm · 0.69mm/px · z∈[-195,-16]mm · 6 of 28 slices shown (2 of 3)]
[im 1/28]
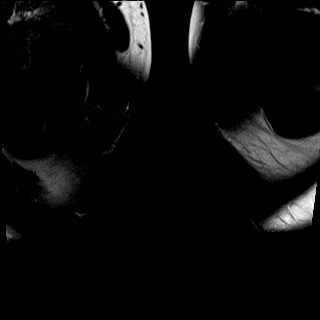
[im 6/28]
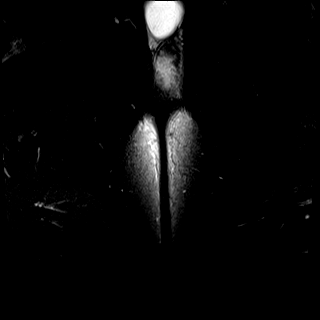
[im 11/28]
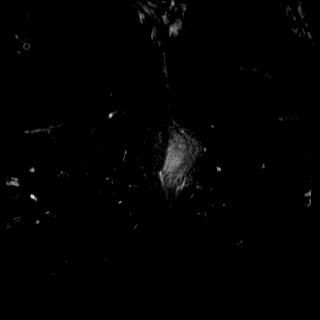
[im 17/28]
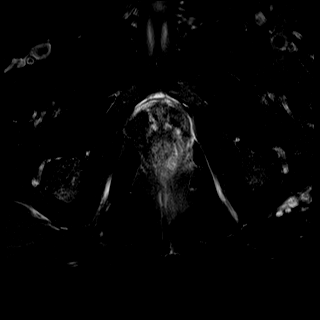
[im 22/28]
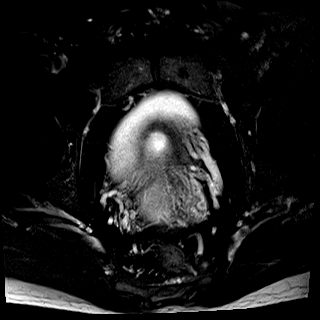
[im 28/28]
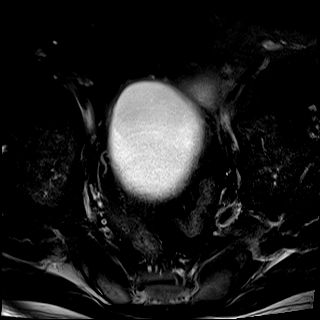

[Series 7: T2 fat-sat · coronal · 4.0mm · 0.75mm/px · 4 of 23 slices shown (3 of 3)]
[im 1/23]
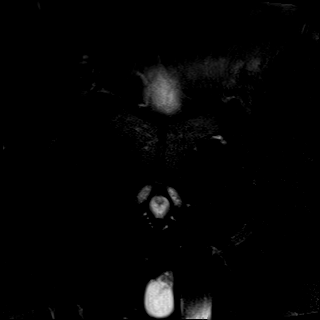
[im 6/23]
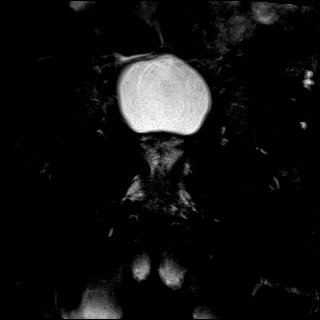
[im 12/23]
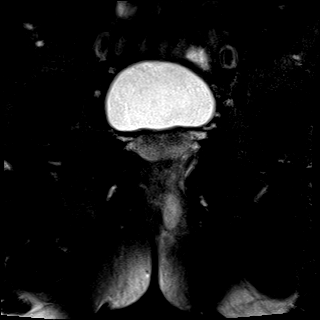
[im 17/23]
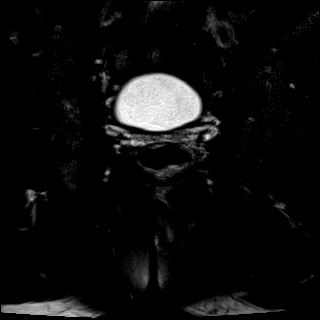

[30 of 48 positions shown; findings below may reference images not displayed]

FINDINGS: Urinary Tract: Urinary bladder unremarkable, no distal ureteral
dilatation.

Bowel: Lower intersphincteric left perianal abscess on the left
measuring 3.0 by 1.7 by 2.1 cm, with surrounding enhancing margins.
No well-defined drainage pathway to the cutaneous surface. The
center of the abscess is at the 4 o'clock position with respect to
the anal canal. Illustrative images are image [DATE] and image [DATE].
Mild adjacent subcutaneous inflammatory findings along the
ischioanal fossa.

Vascular/Lymphatic: Unremarkable

Reproductive:  Unremarkable

Other:  No supplemental non-categorized findings.

Musculoskeletal: Small right eccentric Tarlov cyst at the S1-2
level. Transitional lumbosacral vertebra with a central disc
protrusion at the level immediately above, but no observed
impingement.
IMPRESSION: 1. Lower intersphincteric left perianal abscess measuring 3.0 by
by 2.1 cm at the 4 o'clock position, with surrounding enhancing
margins. No well-defined drainage pathway to the cutaneous surface.
2. Small right eccentric Tarlov cyst at the S1-2 level.
3. Transitional lumbosacral vertebra with a central disc protrusion
at the level immediately above, but no observed impingement.

## 2020-06-28 ENCOUNTER — Encounter: Payer: Self-pay | Admitting: Medical

## 2020-06-28 LAB — STOOL CULTURE
MICRO NUMBER:: 10715084
MICRO NUMBER:: 10715085
MICRO NUMBER:: 10715087
SHIGA RESULT:: NOT DETECTED
SPECIMEN QUALITY:: ADEQUATE
SPECIMEN QUALITY:: ADEQUATE
SPECIMEN QUALITY:: ADEQUATE

## 2020-06-28 LAB — OVA AND PARASITE EXAMINATION
CONCENTRATE RESULT:: NONE SEEN
MICRO NUMBER:: 10715086
SPECIMEN QUALITY:: ADEQUATE
TRICHROME RESULT:: NONE SEEN

## 2020-06-29 ENCOUNTER — Telehealth: Payer: Self-pay | Admitting: Medical

## 2020-06-29 MED ORDER — METRONIDAZOLE 500 MG PO TABS: 500.0000 mg | ORAL_TABLET | Freq: Three times a day (TID) | ORAL | 0 refills | Status: DC

## 2020-06-29 NOTE — Telephone Encounter (Signed)
Rx flagyl sent to pt pharmacy. 

## 2020-07-02 LAB — CLOSTRIDIUM DIFFICILE CULTURE-FECAL

## 2020-07-02 LAB — C.DIFF TOXINB QL PCR: CLOSTRIDIUM DIFFICILE TOXINB,QL REAL TIME PCR: DETECTED — CR

## 2020-07-26 ENCOUNTER — Encounter: Payer: Self-pay | Admitting: Medical

## 2020-07-26 MED ORDER — FAMOTIDINE 20 MG PO TABS: ORAL_TABLET | ORAL | 5 refills | Status: DC

## 2020-10-16 DIAGNOSIS — L02214 Cutaneous abscess of groin: Secondary | ICD-10-CM | POA: Diagnosis not present

## 2020-11-12 DIAGNOSIS — J310 Chronic rhinitis: Secondary | ICD-10-CM | POA: Diagnosis not present

## 2020-11-12 DIAGNOSIS — H6982 Other specified disorders of Eustachian tube, left ear: Secondary | ICD-10-CM | POA: Diagnosis not present

## 2020-11-12 DIAGNOSIS — J342 Deviated nasal septum: Secondary | ICD-10-CM | POA: Diagnosis not present

## 2020-11-12 DIAGNOSIS — J328 Other chronic sinusitis: Secondary | ICD-10-CM | POA: Diagnosis not present

## 2020-11-15 DIAGNOSIS — J310 Chronic rhinitis: Secondary | ICD-10-CM | POA: Diagnosis not present

## 2020-11-15 DIAGNOSIS — J342 Deviated nasal septum: Secondary | ICD-10-CM | POA: Diagnosis not present

## 2020-11-15 DIAGNOSIS — M26629 Arthralgia of temporomandibular joint, unspecified side: Secondary | ICD-10-CM | POA: Diagnosis not present

## 2020-12-25 ENCOUNTER — Other Ambulatory Visit: Payer: Self-pay

## 2020-12-25 ENCOUNTER — Encounter: Payer: Self-pay | Admitting: Medical

## 2020-12-25 ENCOUNTER — Emergency Department
Admission: EM | Admit: 2020-12-25 | Discharge: 2020-12-25 | Disposition: A | Discharge status: Home / Self Care | Payer: Federal, State, Local not specified - PPO | Attending: Student in an Organized Health Care Education/Training Program | Admitting: Student in an Organized Health Care Education/Training Program

## 2020-12-25 ENCOUNTER — Emergency Department: Payer: Federal, State, Local not specified - PPO

## 2020-12-25 ENCOUNTER — Telehealth: Payer: Self-pay

## 2020-12-25 DIAGNOSIS — Z5321 Procedure and treatment not carried out due to patient leaving prior to being seen by health care provider: Secondary | ICD-10-CM | POA: Diagnosis not present

## 2020-12-25 DIAGNOSIS — R059 Cough, unspecified: Secondary | ICD-10-CM | POA: Insufficient documentation

## 2020-12-25 DIAGNOSIS — R079 Chest pain, unspecified: Secondary | ICD-10-CM | POA: Diagnosis not present

## 2020-12-25 DIAGNOSIS — R202 Paresthesia of skin: Secondary | ICD-10-CM | POA: Insufficient documentation

## 2020-12-25 LAB — BASIC METABOLIC PANEL
Anion gap: 10 (ref 5–15)
BUN: 10 mg/dL (ref 6–20)
CO2: 23 mmol/L (ref 22–32)
Calcium: 9.3 mg/dL (ref 8.9–10.3)
Chloride: 106 mmol/L (ref 98–111)
Creatinine, Ser: 0.94 mg/dL (ref 0.61–1.24)
GFR, Estimated: >60 mL/min (ref >60)
Glucose, Bld: 95 mg/dL (ref 70–99)
Potassium: 4.5 mmol/L (ref 3.5–5.1)
Sodium: 139 mmol/L (ref 135–145)

## 2020-12-25 LAB — TROPONIN I (HIGH SENSITIVITY): Troponin I (High Sensitivity): 3 ng/L (ref <18)

## 2020-12-25 NOTE — Telephone Encounter (Signed)
Nurse Assessment Nurse: Jacqulyn Bath, RN, Erin Date/Time (Eastern Time): 12/25/2020 7:07:34 AM Confirm and document reason for call. If symptomatic, describe symptoms. ---Caller states he has patulous eustachian tube dysfunction, with build up congestion and feels like moving to chest and back causing pain. Has a history of a cracked rib. Chest and back pain on the left side and numbness in the left hand. States has had this feeling with on the left side of the chest and left hand and arm, but has never had pain in the back before. No fever, (tactile temp) Does the patient have any new or worsening symptoms? ---Yes Will a triage be completed? ---Yes Related visit to physician within the last 2 weeks? ---No Does the PT have any chronic conditions? (i.e. diabetes, asthma, this includes High risk factors for pregnancy, etc.) ---Yes List chronic conditions. ---GERD Is this a behavioral health or substance abuse call? ---No Guidelines Guideline Title Affirmed Question Affirmed Notes Nurse Date/Time (Eastern Time) Chest Pain [1] Chest pain lasts > 5 minutes AND [2] age > 48 Long, RN, Erin 12/25/2020 7:14:27 AM Disp. Time Lamount Cohen Time) Disposition Final User 12/25/2020 7:06:07 AM Send to Urgent Queue Pearletha Furl 12/25/2020 7:22:45 AM 911 Outcome Documentation Long, RN, Denny Peon Reason: Caller Refused PLEASE NOTE: All timestamps contained within this report are represented as [place] Standard Time. CONFIDENTIALTY NOTICE: This fax transmission is intended only for the addressee. It contains information that is legally privileged, confidential or otherwise protected from use or disclosure. If you are not the intended recipient, you are strictly prohibited from reviewing, disclosing, copying using or disseminating any of this information or taking any action in reliance on or regarding this information. If you have received this fax in error, please notify us immediately by telephone so that we can arrange  for its return to [place]. Phone: [phone], Toll-Free: [phone], Fax: [phone] Page: 2 of 2 Call Id: [id number] 12/25/2020 7:21:52 AM Call EMS 911 Now Yes Long, RN, Antony Madura Disagree/Comply Disagree Caller Understands Yes PreDisposition InappropriateToAsk Care Advice Given Per Guideline CALL EMS 911 NOW: * Immediate medical attention is needed. You need to hang up and call 911 (or an ambulance). Comments User: Cooper Render, RN Date/Time Lamount Cohen Time): 12/25/2020 7:15:04 AM Caller endorses that has had both vaccines User: Cooper Render, RN Date/Time Lamount Cohen Time): 12/25/2020 7:16:34 AM Endorses chest pain with movement and cough User: Cooper Render, RN Date/Time (Eastern Time): 12/25/2020 7:21:29 AM Caller endorses that he has had a scare like this before and it was not cardiac. Caller also endorse that he possibly injured himself in the garage this last week User: Cooper Render, RN Date/Time Lamount Cohen Time): 12/25/2020 7:24:10 AM Caller wanting to see [name], PA for appointment for EKG and possibly to have him listen to his chest Referrals GO TO FACILITY UNDECIDED  Pt in ED.

## 2020-12-25 NOTE — ED Triage Notes (Signed)
Pt c/o left upper chest pain radiating into his back since Sunday after moving things around in the garage, worse with movement, pt is in NAD at present.

## 2020-12-25 NOTE — ED Notes (Signed)
Called pt for reassessment of vitals in waiting room. No answer at this time.

## 2020-12-25 NOTE — ED Notes (Signed)
Called   No answer lobby

## 2020-12-26 ENCOUNTER — Telehealth (INDEPENDENT_AMBULATORY_CARE_PROVIDER_SITE_OTHER): Payer: Federal, State, Local not specified - PPO | Admitting: Medical

## 2020-12-26 DIAGNOSIS — R0781 Pleurodynia: Secondary | ICD-10-CM

## 2020-12-26 DIAGNOSIS — H6982 Other specified disorders of Eustachian tube, left ear: Secondary | ICD-10-CM

## 2020-12-26 DIAGNOSIS — R0789 Other chest pain: Secondary | ICD-10-CM

## 2020-12-26 DIAGNOSIS — J329 Chronic sinusitis, unspecified: Secondary | ICD-10-CM | POA: Diagnosis not present

## 2020-12-26 LAB — TROPONIN I: Troponin I: <3 ng/L (ref <=47)

## 2020-12-26 MED ORDER — AZITHROMYCIN 250 MG PO TABS
ORAL_TABLET | ORAL | 0 refills | Status: DC
Start: 2020-12-26 — End: 2021-07-22

## 2020-12-26 NOTE — Addendum Note (Signed)
Addended by: Rosita Kea on: 12/26/2020 04:10 PM   Modules accepted: Orders

## 2020-12-26 NOTE — Patient Instructions (Addendum)
You do have history of chronic eustachian tube dysfunction and being treated/followed by ENT.  We will continue to follow their advice.  Sounds like you might need to have surgery as you explained.  Also you describe probable sinusitis so we will go ahead and prescribe azithromycin antibiotic.  Recent atypical left-sided chest pain.  Work-up in the ED was negative.  Reviewed labs, chest x-ray and EKG. ( Note patient sent me a MyChart message with snapshots of labs and EKG) history of remote left-sided rib fracture in 2019.  Negative cardiac work-up about 6 years ago in [place].  I think is best to go ahead and get second stat troponin through our office today.  Explained to patient, to get that done.  Also asked to send me blood pressure readings from yesterday.  If normal blood pressure readings would prescribe diclofenac in the event of muscle/cartilage pain.  In light of prior cardiac work-up and atypical nature of pain will likely go ahead and refer to cardiologist for evaluation as well.   Follow-up in 7 to 10 days or as needed.

## 2020-12-26 NOTE — Addendum Note (Signed)
Addended by: Gwenevere Abbot on: 12/26/2020 07:22 PM   Modules accepted: Orders

## 2020-12-26 NOTE — Progress Notes (Signed)
Subjective:    Patient ID: Mark Duncan, male    DOB: 1971/06/08, 50 y.o.   MRN: [id number]  HPI  Virtual Visit via Video Note  I connected with [name] on 12/26/20 at 11:00 AM EST by a video enabled telemedicine application and verified that I am speaking with the correct person using two identifiers.  Location: Patient: home Provider: office  Pt did not get VS today. Nor can I see VS from ED visit.   I discussed the limitations of evaluation and management by telemedicine and the availability of in person appointments. The patient expressed understanding and agreed to proceed.  History of Present Illness:  Pt states he was trying to see his ent yesterday. Pt told by ent to go to ED.  He was at ent to follow up on Eustachian tube dysfunction. He is still having left side eustachian tube dysfunction. ENT did not see him. Pt is using astelin. Recently he has brownish/rust colored mucus.  Pt explains to me he had left side chest pain and left side back pain.   He went to ED and had work up for troponin and bmp. Both were negative. Cxr was negative. I don't see ekg in the computer though he had it ekg. When I try to view stated ekg not released? Pt left ED do to long wait and states he had to get to work.  Pt states he still is feeling some faint pain. Pt states when he presses on left side chest wall and feels pain.  No sob or wheezing. Pt states when he presses over prior rib fx area has pain.  IMPRESSION: 1. Nondisplaced fracture lateral left tenth rib. No other bone lesion.  2. No pneumothorax. No lung edema or consolidation. No pleural effusion  3. Pulmonary nodular opacities in the right middle lobe, largest measuring 4 mm. No follow-up needed if patient is low-risk (and has no known or suspected primary neoplasm). Non-contrast chest CT can be considered in 12 months if patient is high-risk. This recommendation follows the consensus statement: Guidelines  for Management of Incidental Pulmonary Nodules Detected on CT Images: From the Fleischner Society 2017; Radiology 2017; 284:228-243.  4.  No evident adenopathy.  5.  No evident vascular lesion on this noncontrast enhanced study.  Pt has lidocaine and states ibuprofen has helped.   Pt notes years ago in [place] had stress test and echo. Both negative. Studies in 2015.     Past Medical History:  Diagnosis Date  . Blood transfusion without reported diagnosis   . GERD (gastroesophageal reflux disease)   . Hyperlipidemia      Social History   Socioeconomic History  . Marital status: Married    Spouse name: Not on file  . Number of children: Not on file  . Years of education: Not on file  . Highest education level: Not on file  Occupational History  . Not on file  Tobacco Use  . Smoking status: Light Tobacco Smoker  . Smokeless tobacco: Former Clinical biochemist  . Vaping Use: Never used  Substance and Sexual Activity  . Alcohol use: No    Alcohol/week: 0.0 standard drinks  . Drug use: Never  . Sexual activity: Not on file  Other Topics Concern  . Not on file  Social History Narrative  . Not on file   Social Determinants of Health   Financial Resource Strain: Not on file  Food Insecurity: Not on file  Transportation Needs: Not  on file  Physical Activity: Not on file  Stress: Not on file  Social Connections: Not on file  Intimate Partner Violence: Not on file    Past Surgical History:  Procedure Laterality Date  . HAND SURGERY     Glass removed  . KNEE ARTHROSCOPY W/ MENISCAL REPAIR      Family History  Problem Relation Age of Onset  . Heart disease Maternal Uncle   . Heart disease Maternal Grandmother     Allergies  Allergen Reactions  . Fish Allergy Hives, Itching and Swelling    Grouper Only    Current Outpatient Medications on File Prior to Visit  Medication Sig Dispense Refill  . azelastine (ASTELIN) 0.1 % nasal spray Place 2 sprays into both  nostrils 2 (two) times daily. Use in each nostril as directed (Patient not taking: Reported on 06/21/2020) 30 mL 3  . diclofenac (VOLTAREN) 75 MG EC tablet Take 1 tablet (75 mg total) by mouth 2 (two) times daily. (Patient not taking: Reported on 06/21/2020) 30 tablet 0  . Diclofenac Sodium (PENNSAID) 2 % SOLN Place 1 application onto the skin 2 (two) times daily. (Patient not taking: Reported on 06/21/2020) 1 Bottle 2  . esomeprazole (NEXIUM) 40 MG capsule Take 1 capsule (40 mg total) by mouth daily. (Patient not taking: Reported on 06/21/2020) 30 capsule 1  . famotidine (PEPCID) 20 MG tablet 1-2 tab po q day 60 tablet 5  . fluticasone (FLONASE) 50 MCG/ACT nasal spray Place 2 sprays into both nostrils daily. (Patient not taking: Reported on 06/21/2020) 16 g 3  . levocetirizine (XYZAL) 5 MG tablet TAKE ONE TABLET BY MOUTH EVERY EVENING (Patient not taking: Reported on 06/21/2020) 30 tablet 0  . mometasone (NASONEX) 50 MCG/ACT nasal spray Place 2 sprays into the nose daily. (Patient not taking: Reported on 06/21/2020) 17 g 1  . montelukast (SINGULAIR) 10 MG tablet Take 1 tablet (10 mg total) by mouth at bedtime. (Patient not taking: Reported on 06/21/2020) 30 tablet 3  . ondansetron (ZOFRAN ODT) 8 MG disintegrating tablet Take 1 tablet (8 mg total) by mouth every 8 (eight) hours as needed for nausea or vomiting. 20 tablet 0  . predniSONE (DELTASONE) 10 MG tablet 4 tab po day 1, 3 tab po day 2, 2 tab po day 3, 1 tab po day 4 (Patient not taking: Reported on 06/21/2020) 10 tablet 0  . promethazine (PHENERGAN) 25 MG tablet Take 1 tablet (25 mg total) by mouth every 6 (six) hours as needed for nausea or vomiting. (Patient not taking: Reported on 06/21/2020) 10 tablet 0  . rosuvastatin (CRESTOR) 20 MG tablet Take 1 tablet (20 mg total) by mouth daily. 90 tablet 3   No current facility-administered medications on file prior to visit.    There were no vitals taken for this visit.      Observations/Objective:   General-no acute distress, pleasant, oriented. Lungs- on inspection lungs appear unlabored. Neck- no tracheal deviation or jvd on inspection. Neuro- gross motor function appears intact.    Assessment and Plan: You do have history of chronic eustachian tube dysfunction and being treated/followed by ENT.  We will continue to follow their advice.  Sounds like you might need to have surgery as you explained.  Also you describe probable sinusitis so we will go ahead and prescribe azithromycin antibiotic.  Recent atypical left-sided chest pain.  Work-up in the ED was negative.  Reviewed labs, chest x-ray and EKG. ( Note patient sent me a MyChart message  with snapshots of labs and EKG) history of remote left-sided rib fracture in 2019.  Negative cardiac work-up about 6 years ago in [place].  I think is best to go ahead and get second stat troponin through our office today.  Explained to patient, to get that done.  Also asked to send me blood pressure readings from yesterday.  If normal blood pressure readings would prescribe diclofenac in the event of muscle/cartilage pain.  In light of prior cardiac work-up and atypical nature of pain will likely go ahead and refer to cardiologist for evaluation as well.   Follow-up in 7 to 10 days or as needed.    Follow Up Instructions:    I discussed the assessment and treatment plan with the patient. The patient was provided an opportunity to ask questions and all were answered. The patient agreed with the plan and demonstrated an understanding of the instructions.   The patient was advised to call back or seek an in-person evaluation if the symptoms worsen or if the condition fails to improve as anticipated.     Time spent with patient today was 40  minutes which consisted of chart review, discussing diagnoses, work up, treatment, answering questions  and documentation.   [name], PA-C    Review of  Systems  Constitutional: Negative for appetite change, fatigue and fever.  HENT: Positive for congestion, ear pain and sinus pressure. Negative for sinus pain, sore throat and tinnitus.   Respiratory: Positive for cough.        Rare cough.  Cardiovascular: Negative for chest pain and palpitations.  Gastrointestinal: Negative for abdominal pain.  Musculoskeletal: Negative for back pain.  Skin: Negative for rash.  Neurological: Negative for dizziness, syncope, weakness, light-headedness and headaches.  Hematological: Negative for adenopathy. Does not bruise/bleed easily.  Psychiatric/Behavioral: Negative for behavioral problems and decreased concentration. The patient is not nervous/anxious.        Objective:   Physical Exam        Assessment & Plan:

## 2020-12-26 NOTE — Addendum Note (Signed)
Addended by: Rosita Kea on: 12/26/2020 04:11 PM   Modules accepted: Orders

## 2020-12-27 LAB — LIPID PANEL
Cholesterol: 190 mg/dL (ref 0–200)
HDL: 30.7 mg/dL — ABNORMAL LOW (ref >39.00)
LDL Cholesterol: 127 mg/dL — ABNORMAL HIGH (ref 0–99)
NonHDL: 159.64
Total CHOL/HDL Ratio: 6
Triglycerides: 162 mg/dL — ABNORMAL HIGH (ref 0.0–149.0)
VLDL: 32.4 mg/dL (ref 0.0–40.0)

## 2021-01-09 ENCOUNTER — Telehealth (INDEPENDENT_AMBULATORY_CARE_PROVIDER_SITE_OTHER): Payer: Federal, State, Local not specified - PPO | Admitting: Medical

## 2021-01-09 ENCOUNTER — Other Ambulatory Visit: Payer: Self-pay

## 2021-01-09 ENCOUNTER — Encounter: Payer: Self-pay | Admitting: Medical

## 2021-01-09 DIAGNOSIS — J329 Chronic sinusitis, unspecified: Secondary | ICD-10-CM

## 2021-01-09 DIAGNOSIS — H6982 Other specified disorders of Eustachian tube, left ear: Secondary | ICD-10-CM | POA: Diagnosis not present

## 2021-01-09 MED ORDER — CLINDAMYCIN HCL 150 MG PO CAPS: 150.0000 mg | ORAL_CAPSULE | Freq: Three times a day (TID) | ORAL | 0 refills | Status: DC

## 2021-01-09 MED ORDER — PREDNISONE 10 MG (21) PO TBPK: ORAL_TABLET | ORAL | 0 refills | Status: DC

## 2021-01-09 NOTE — Patient Instructions (Addendum)
Hx of chronic left side sinus pressure and eustachian tube dysfunction. Symptoms not improved. Probable allergic component.  Will rx clindamycin antibiotic. Rx advisement. Use probiotics.   Rx taper prednisone for 6 days.  Follow up with ENT on 01/15/2021 or with me sooner if needed.

## 2021-01-09 NOTE — Progress Notes (Signed)
Subjective:    Patient ID: Mark Duncan, male    DOB: 1971/12/01, 50 y.o.   MRN: [id number]  HPI  Virtual Visit via Video Note  I connected with Mark Duncan on 01/09/21 at  2:40 PM EST by a video enabled telemedicine application and verified that I am speaking with the correct person using two identifiers.  Location: Patient: home Provider: office.  Participants- pt and myself.   I discussed the limitations of evaluation and management by telemedicine and the availability of in person appointments. The patient expressed understanding and agreed to proceed.  History of Present Illness: Pt in for some persistent sinus pressure with blocked sensation. This is despite azithromycin. Pt states when he palpates left maxillary hears some sounds. Pt also states some mild cough. When he blows nose gets clear dc with some occasional brown dc that is pasty.  Pt has seen ENT for chronic condition.   ENT A/P  1) eustachian tube dysfunction left ear 2) nasal septal deviation 3) chronic rhinitis 4) chronic sinusitis  [name] is a patient with history of chronic sinus and eustachian tube problems and septal deviation. He is wishing to pursue additional options to obtain relief. He is wishing to discuss the possibility of surgery with one of our surgeons. I informed him that a new CT scan would be needed before a surgical recommendation could be made and he understands this. Additionally, I informed him that septoplasty and sinus surgery (if recommended) would not necessarily improve his issue with eustachian tube dysfunction. We will schedule an appointment for him to meet with one of our surgeons in the near future as he is anxious to pursue this. In the meantime, I have asked him to use fluticasone nasal spray daily.  Kermit Balo. [place]hour, PA-C [facility]  Pt is not using afrin. Though years or so ago he did use Afrin for 2 weeks. He is aware not to use.      Observations/Objective:  General-no acute distress, pleasant, oriented. Lungs- on inspection lungs appear unlabored. Neck- no tracheal deviation or jvd on inspection. Neuro- gross motor function appears intact. heent- left maxillary sinus pressure.    Assessment and Plan: Hx of chronic left side sinus pressure and eustachian tube dysfunction. Symptoms not improved. Probable allergic component.  Will rx clindamycin antibiotic. Rx advisement. Use probiotics.  Rx taper prednisone for 6 days.  Follow up with ENT on 01/15/2021 or with me sooner if needed.  Follow Up Instructions:    I discussed the assessment and treatment plan with the patient. The patient was provided an opportunity to ask questions and all were answered. The patient agreed with the plan and demonstrated an understanding of the instructions.   The patient was advised to call back or seek an in-person evaluation if the symptoms worsen or if the condition fails to improve as anticipated.  Time spent with patient today was 20   minutes which consisted of chart revdiew, discussing diagnosis, work up treatment and documentation.[name], PA-C   Review of Systems  Constitutional: Negative for chills, fatigue and fever.  HENT: Positive for congestion, ear pain, sinus pressure and sinus pain. Negative for ear discharge, sneezing and sore throat.   Respiratory: Negative for cough, chest tightness and shortness of breath.   Cardiovascular: Negative for chest pain and palpitations.  Gastrointestinal: Negative for abdominal pain and constipation.  Musculoskeletal: Negative for back pain.  Skin: Negative for rash.  Neurological: Negative for dizziness, seizures,  syncope, weakness, light-headedness and headaches.  Hematological: Negative for adenopathy. Does not bruise/bleed easily.  Psychiatric/Behavioral: Negative for behavioral problems and confusion.       Objective:   Physical Exam        Assessment &  Plan:

## 2021-02-06 DIAGNOSIS — M530 Cervicocranial syndrome: Secondary | ICD-10-CM | POA: Diagnosis not present

## 2021-02-11 DIAGNOSIS — M531 Cervicobrachial syndrome: Secondary | ICD-10-CM | POA: Diagnosis not present

## 2021-02-11 DIAGNOSIS — M5134 Other intervertebral disc degeneration, thoracic region: Secondary | ICD-10-CM | POA: Diagnosis not present

## 2021-02-11 DIAGNOSIS — M5032 Other cervical disc degeneration, mid-cervical region, unspecified level: Secondary | ICD-10-CM | POA: Diagnosis not present

## 2021-02-11 DIAGNOSIS — M9901 Segmental and somatic dysfunction of cervical region: Secondary | ICD-10-CM | POA: Diagnosis not present

## 2021-02-12 DIAGNOSIS — M531 Cervicobrachial syndrome: Secondary | ICD-10-CM | POA: Diagnosis not present

## 2021-02-12 DIAGNOSIS — M5134 Other intervertebral disc degeneration, thoracic region: Secondary | ICD-10-CM | POA: Diagnosis not present

## 2021-02-12 DIAGNOSIS — M9901 Segmental and somatic dysfunction of cervical region: Secondary | ICD-10-CM | POA: Diagnosis not present

## 2021-02-12 DIAGNOSIS — M5032 Other cervical disc degeneration, mid-cervical region, unspecified level: Secondary | ICD-10-CM | POA: Diagnosis not present

## 2021-02-19 DIAGNOSIS — M5032 Other cervical disc degeneration, mid-cervical region, unspecified level: Secondary | ICD-10-CM | POA: Diagnosis not present

## 2021-02-19 DIAGNOSIS — M5134 Other intervertebral disc degeneration, thoracic region: Secondary | ICD-10-CM | POA: Diagnosis not present

## 2021-02-19 DIAGNOSIS — M531 Cervicobrachial syndrome: Secondary | ICD-10-CM | POA: Diagnosis not present

## 2021-02-19 DIAGNOSIS — M9901 Segmental and somatic dysfunction of cervical region: Secondary | ICD-10-CM | POA: Diagnosis not present

## 2021-02-21 ENCOUNTER — Telehealth: Payer: Self-pay

## 2021-02-21 NOTE — Telephone Encounter (Signed)
Called pt and offered an appointment and he stated he would just check himself into the hospital

## 2021-02-21 NOTE — Telephone Encounter (Signed)
Caller is requesting provider RN to call him back. Caller states has a ongoing condition with his ear, had a horrible night. He had 3 CTs done, 30 doctors. Caller is asking for advice on what to do next.  Additional comments: Caller declined RN triage. Caller states it is a bubbling sound in his ear. Provided office hours

## 2021-03-25 ENCOUNTER — Encounter: Payer: Federal, State, Local not specified - PPO | Admitting: Medical

## 2021-03-26 DIAGNOSIS — J342 Deviated nasal septum: Secondary | ICD-10-CM | POA: Diagnosis not present

## 2021-03-26 DIAGNOSIS — R0981 Nasal congestion: Secondary | ICD-10-CM | POA: Diagnosis not present

## 2021-03-26 DIAGNOSIS — H6982 Other specified disorders of Eustachian tube, left ear: Secondary | ICD-10-CM | POA: Diagnosis not present

## 2021-03-26 DIAGNOSIS — J322 Chronic ethmoidal sinusitis: Secondary | ICD-10-CM | POA: Diagnosis not present

## 2021-05-07 DIAGNOSIS — J321 Chronic frontal sinusitis: Secondary | ICD-10-CM | POA: Diagnosis not present

## 2021-05-07 DIAGNOSIS — J320 Chronic maxillary sinusitis: Secondary | ICD-10-CM | POA: Diagnosis not present

## 2021-05-07 DIAGNOSIS — J3489 Other specified disorders of nose and nasal sinuses: Secondary | ICD-10-CM | POA: Diagnosis not present

## 2021-05-07 DIAGNOSIS — R0981 Nasal congestion: Secondary | ICD-10-CM | POA: Diagnosis not present

## 2021-05-07 DIAGNOSIS — J323 Chronic sphenoidal sinusitis: Secondary | ICD-10-CM | POA: Diagnosis not present

## 2021-05-07 DIAGNOSIS — J0120 Acute ethmoidal sinusitis, unspecified: Secondary | ICD-10-CM | POA: Diagnosis not present

## 2021-05-09 DIAGNOSIS — J342 Deviated nasal septum: Secondary | ICD-10-CM | POA: Diagnosis not present

## 2021-05-09 DIAGNOSIS — J322 Chronic ethmoidal sinusitis: Secondary | ICD-10-CM | POA: Diagnosis not present

## 2021-05-09 DIAGNOSIS — J3489 Other specified disorders of nose and nasal sinuses: Secondary | ICD-10-CM | POA: Diagnosis not present

## 2021-06-13 DIAGNOSIS — J342 Deviated nasal septum: Secondary | ICD-10-CM | POA: Diagnosis not present

## 2021-06-13 DIAGNOSIS — F17210 Nicotine dependence, cigarettes, uncomplicated: Secondary | ICD-10-CM | POA: Diagnosis not present

## 2021-06-13 DIAGNOSIS — J343 Hypertrophy of nasal turbinates: Secondary | ICD-10-CM | POA: Diagnosis not present

## 2021-06-13 DIAGNOSIS — J329 Chronic sinusitis, unspecified: Secondary | ICD-10-CM | POA: Diagnosis not present

## 2021-06-13 DIAGNOSIS — J3489 Other specified disorders of nose and nasal sinuses: Secondary | ICD-10-CM | POA: Diagnosis not present

## 2021-06-13 DIAGNOSIS — J328 Other chronic sinusitis: Secondary | ICD-10-CM | POA: Diagnosis not present

## 2021-06-13 DIAGNOSIS — J302 Other seasonal allergic rhinitis: Secondary | ICD-10-CM | POA: Diagnosis not present

## 2021-06-13 DIAGNOSIS — J322 Chronic ethmoidal sinusitis: Secondary | ICD-10-CM | POA: Diagnosis not present

## 2021-07-22 ENCOUNTER — Other Ambulatory Visit: Payer: Self-pay

## 2021-07-22 ENCOUNTER — Encounter: Payer: Self-pay | Admitting: Medical

## 2021-07-22 ENCOUNTER — Telehealth (INDEPENDENT_AMBULATORY_CARE_PROVIDER_SITE_OTHER): Payer: Federal, State, Local not specified - PPO | Admitting: Family Medicine

## 2021-07-22 DIAGNOSIS — U071 COVID-19: Secondary | ICD-10-CM | POA: Diagnosis not present

## 2021-07-22 MED ORDER — MOLNUPIRAVIR EUA 200MG CAPSULE: 4.0000 | ORAL_CAPSULE | Freq: Two times a day (BID) | ORAL | 0 refills | Status: AC

## 2021-07-22 NOTE — Progress Notes (Signed)
[facility] [address], [address] [place], [place] [address] [phone]  Date:  07/22/2021   Name:  Mark Duncan   DOB:  02-22-1971   MRN:  [id number]  PCP:  [name], PA-C    Chief Complaint: Covid Positive (08/12 - headache , fever , chills , chest pain, weak , cough)   History of Present Illness:  Mark Duncan is a 50 y.o. very pleasant male patient who presents with the following:  Primary patient of my partner [name], virtual visit today for concern of COVID-19 Patient with history of CAD, hyperlipidemia, GERD I have not seen him myself previously  Patient location is home, my location is office.  Patient identity confirmed with 2 factors, he gives consent for virtual visit today. The pt and myself are present on the call today   This past Friday he noted chills and tested positive for covid- today is Monday  His son tested positive for covid the day prior -  He had fevers and sweats, bad body aches  He has smoked for 25 years or so-  He is trying to stay hydrate but he is coughing a lot He has checked his temp- up to 100.4 max He is coughing mostly at night No vomiting, intermittent diarrhea  He does not feel great but is not in any distress  He is taking some OTC allergy medication He is also using tylenol - no NSAIDs so far   COVID-19 vaccine- he had 2 doses but no booster yet   Patient Active Problem List   Diagnosis Date Noted   Coronary artery calcification of native artery 04/25/2019   Mixed hyperlipidemia 04/25/2019   Atypical chest pain 05/24/2015   Rib pain on left side 05/24/2015   GERD (gastroesophageal reflux disease) 05/24/2015   History of bloody stools 05/24/2015   Smoker 05/24/2015    Past Medical History:  Diagnosis Date   Blood transfusion without reported diagnosis    GERD (gastroesophageal reflux disease)    Hyperlipidemia     Past Surgical History:   Procedure Laterality Date   HAND SURGERY     Glass removed   KNEE ARTHROSCOPY W/ MENISCAL REPAIR      Social History   Tobacco Use   Smoking status: Light Smoker   Smokeless tobacco: Former  Building services engineer Use: Never used  Substance Use Topics   Alcohol use: No    Alcohol/week: 0.0 standard drinks   Drug use: Never    Family History  Problem Relation Age of Onset   Heart disease Maternal Uncle    Heart disease Maternal Grandmother     Allergies  Allergen Reactions   Fish Allergy Hives, Itching and Swelling    Grouper Only    Medication list has been reviewed and updated.  Current Outpatient Medications on File Prior to Visit  Medication Sig Dispense Refill   azelastine (ASTELIN) 0.1 % nasal spray Place 2 sprays into both nostrils 2 (two) times daily. Use in each nostril as directed (Patient not taking: Reported on 06/21/2020) 30 mL 3   azithromycin (ZITHROMAX) 250 MG tablet Take 2 tablets by mouth on day 1, followed by 1 tablet by mouth daily for 4 days. 6 tablet 0   clindamycin (CLEOCIN) 150 MG capsule Take 1 capsule (150 mg total) by mouth 3 (three) times daily. 21 capsule 0   diclofenac (VOLTAREN) 75 MG EC tablet Take 1  tablet (75 mg total) by mouth 2 (two) times daily. (Patient not taking: Reported on 06/21/2020) 30 tablet 0   Diclofenac Sodium (PENNSAID) 2 % SOLN Place 1 application onto the skin 2 (two) times daily. (Patient not taking: Reported on 06/21/2020) 1 Bottle 2   esomeprazole (NEXIUM) 40 MG capsule Take 1 capsule (40 mg total) by mouth daily. (Patient not taking: Reported on 06/21/2020) 30 capsule 1   famotidine (PEPCID) 20 MG tablet 1-2 tab po q day 60 tablet 5   fluticasone (FLONASE) 50 MCG/ACT nasal spray Place 2 sprays into both nostrils daily. (Patient not taking: Reported on 06/21/2020) 16 g 3   levocetirizine (XYZAL) 5 MG tablet TAKE ONE TABLET BY MOUTH EVERY EVENING (Patient not taking: Reported on 06/21/2020) 30 tablet 0   mometasone (NASONEX) 50  MCG/ACT nasal spray Place 2 sprays into the nose daily. (Patient not taking: Reported on 06/21/2020) 17 g 1   montelukast (SINGULAIR) 10 MG tablet Take 1 tablet (10 mg total) by mouth at bedtime. (Patient not taking: Reported on 06/21/2020) 30 tablet 3   ondansetron (ZOFRAN ODT) 8 MG disintegrating tablet Take 1 tablet (8 mg total) by mouth every 8 (eight) hours as needed for nausea or vomiting. 20 tablet 0   predniSONE (STERAPRED UNI-PAK 21 TAB) 10 MG (21) TBPK tablet Standard taper over 6 days. 21 tablet 0   promethazine (PHENERGAN) 25 MG tablet Take 1 tablet (25 mg total) by mouth every 6 (six) hours as needed for nausea or vomiting. (Patient not taking: Reported on 06/21/2020) 10 tablet 0   rosuvastatin (CRESTOR) 20 MG tablet Take 1 tablet (20 mg total) by mouth daily. 90 tablet 3   No current facility-administered medications on file prior to visit.    Review of Systems:  As per HPI- otherwise negative.   Physical Examination: There were no vitals filed for this visit. There were no vitals filed for this visit. There is no height or weight on file to calculate BMI. Ideal Body Weight:    Pt observed via mychart video He looks well, no shortness of breath or distress.  He is not taking any vital signs except for temperature Assessment and Plan: COVID-19 - Plan: molnupiravir EUA 200 mg CAPS Virtual visit today for COVID-19.  Patient is vaccinated but not boosted.  He is generally in good health and symptoms are moderate.  He would like to take antivirals in hopes of getting better more quickly.  There is no chance of his wife becoming pregnant, she had a tubal ligation.  Prescribed molnupiravir, discussed using Tylenol and NSAIDs for symptom control.  Caution regarding total max daily doses of acetaminophen  Discussed quarantine. Let us know if not getting better over the next several days, sooner if worse   Signed Abbe Amsterdam, MD

## 2021-07-31 ENCOUNTER — Emergency Department (HOSPITAL_BASED_OUTPATIENT_CLINIC_OR_DEPARTMENT_OTHER): Payer: Federal, State, Local not specified - PPO

## 2021-07-31 ENCOUNTER — Encounter (HOSPITAL_BASED_OUTPATIENT_CLINIC_OR_DEPARTMENT_OTHER): Payer: Self-pay

## 2021-07-31 ENCOUNTER — Emergency Department (HOSPITAL_BASED_OUTPATIENT_CLINIC_OR_DEPARTMENT_OTHER)
Admission: EM | Admit: 2021-07-31 | Discharge: 2021-07-31 | Disposition: A | Discharge status: Home / Self Care | Payer: Federal, State, Local not specified - PPO | Attending: Emergency Medicine | Admitting: Emergency Medicine

## 2021-07-31 ENCOUNTER — Other Ambulatory Visit: Payer: Self-pay

## 2021-07-31 ENCOUNTER — Telehealth: Payer: Self-pay

## 2021-07-31 DIAGNOSIS — I2510 Atherosclerotic heart disease of native coronary artery without angina pectoris: Secondary | ICD-10-CM | POA: Insufficient documentation

## 2021-07-31 DIAGNOSIS — M62830 Muscle spasm of back: Secondary | ICD-10-CM | POA: Insufficient documentation

## 2021-07-31 DIAGNOSIS — Z8616 Personal history of COVID-19: Secondary | ICD-10-CM | POA: Insufficient documentation

## 2021-07-31 DIAGNOSIS — F17210 Nicotine dependence, cigarettes, uncomplicated: Secondary | ICD-10-CM | POA: Insufficient documentation

## 2021-07-31 DIAGNOSIS — R079 Chest pain, unspecified: Secondary | ICD-10-CM | POA: Diagnosis not present

## 2021-07-31 DIAGNOSIS — M549 Dorsalgia, unspecified: Secondary | ICD-10-CM | POA: Diagnosis not present

## 2021-07-31 DIAGNOSIS — R9431 Abnormal electrocardiogram [ECG] [EKG]: Secondary | ICD-10-CM | POA: Diagnosis not present

## 2021-07-31 DIAGNOSIS — M25511 Pain in right shoulder: Secondary | ICD-10-CM | POA: Diagnosis not present

## 2021-07-31 MED ORDER — CYCLOBENZAPRINE HCL 10 MG PO TABS: 10.0000 mg | ORAL_TABLET | Freq: Two times a day (BID) | ORAL | 0 refills | Status: DC | PRN

## 2021-07-31 NOTE — ED Provider Notes (Signed)
[facility] EMERGENCY DEPARTMENT Provider Note   CSN: [id number] Arrival date & time: 07/31/21  0202     History Chief Complaint  Patient presents with   Back Pain    Mark Duncan is a 50 y.o. male.  The history is provided by the patient.  Back Pain Mark Duncan is a 50 y.o. male who presents to the Emergency Department complaining of back pain.  He presents to the ED complaining of bilateral upper back pain, right greater than left.  Pain is described as a muscle spasm/dull pain.  Pain is constant in nature.    Pain is worse with movement, breathing.    Had covid recently - today is day 12.  Had fever, runny nose, body aches, weakness - overall sxs are improving.    Has no known medical problems.    No hx/o DVT/PE.    No alcohol, uses marijuana.  Has a hx/o CAD in his uncle at the age of 18.. Mom with hx/o DVT. No recent surgery.    Past Medical History:  Diagnosis Date   Blood transfusion without reported diagnosis    GERD (gastroesophageal reflux disease)    Hyperlipidemia     Patient Active Problem List   Diagnosis Date Noted   Coronary artery calcification of native artery 04/25/2019   Mixed hyperlipidemia 04/25/2019   Atypical chest pain 05/24/2015   Rib pain on left side 05/24/2015   GERD (gastroesophageal reflux disease) 05/24/2015   History of bloody stools 05/24/2015   Smoker 05/24/2015    Past Surgical History:  Procedure Laterality Date   HAND SURGERY     Glass removed   KNEE ARTHROSCOPY W/ MENISCAL REPAIR         Family History  Problem Relation Age of Onset   Heart disease Maternal Uncle    Heart disease Maternal Grandmother     Social History   Tobacco Use   Smoking status: Light Smoker    Types: Cigarettes   Smokeless tobacco: Former  Building services engineer Use: Never used  Substance Use Topics   Alcohol use: No    Alcohol/week: 0.0 standard drinks   Drug use: Yes    Types: Marijuana    Home  Medications Prior to Admission medications   Medication Sig Start Date End Date Taking? Authorizing Provider  cyclobenzaprine (FLEXERIL) 10 MG tablet Take 1 tablet (10 mg total) by mouth 2 (two) times daily as needed for muscle spasms. 07/31/21  Yes Tilden Fossa, MD  azelastine (ASTELIN) 0.1 % nasal spray Place 2 sprays into both nostrils 2 (two) times daily. Use in each nostril as directed Patient not taking: Reported on 06/21/2020 10/31/19   Saguier, Ramon Dredge, PA-C  clindamycin (CLEOCIN) 150 MG capsule Take 1 capsule (150 mg total) by mouth 3 (three) times daily. 01/09/21   Saguier, Ramon Dredge, PA-C  diclofenac (VOLTAREN) 75 MG EC tablet Take 1 tablet (75 mg total) by mouth 2 (two) times daily. Patient not taking: Reported on 06/21/2020 04/11/19   Saguier, Ramon Dredge, PA-C  Diclofenac Sodium (PENNSAID) 2 % SOLN Place 1 application onto the skin 2 (two) times daily. Patient not taking: Reported on 06/21/2020 04/14/19   Myra Rude, MD  esomeprazole (NEXIUM) 40 MG capsule Take 1 capsule (40 mg total) by mouth daily. Patient not taking: Reported on 06/21/2020 01/18/19   [place], Betty G, MD  famotidine (PEPCID) 20 MG tablet 1-2 tab po q day 07/26/20   Saguier, Ramon Dredge, PA-C  fluticasone [facility]) 50  MCG/ACT nasal spray Place 2 sprays into both nostrils daily. Patient not taking: Reported on 06/21/2020 10/31/19   Saguier, Ramon Dredge, PA-C  levocetirizine (XYZAL) 5 MG tablet TAKE ONE TABLET BY MOUTH EVERY EVENING Patient not taking: Reported on 06/21/2020 08/22/19   Saguier, Ramon Dredge, PA-C  mometasone (NASONEX) 50 MCG/ACT nasal spray Place 2 sprays into the nose daily. Patient not taking: Reported on 06/21/2020 01/18/19   [place], Betty G, MD  montelukast (SINGULAIR) 10 MG tablet Take 1 tablet (10 mg total) by mouth at bedtime. Patient not taking: Reported on 06/21/2020 04/19/19   Saguier, Ramon Dredge, PA-C  ondansetron (ZOFRAN ODT) 8 MG disintegrating tablet Take 1 tablet (8 mg total) by mouth every 8 (eight) hours as needed for  nausea or vomiting. 06/21/20   Saguier, Ramon Dredge, PA-C  promethazine (PHENERGAN) 25 MG tablet Take 1 tablet (25 mg total) by mouth every 6 (six) hours as needed for nausea or vomiting. Patient not taking: Reported on 06/21/2020 10/29/19   Bethel Born, PA-C  rosuvastatin (CRESTOR) 20 MG tablet Take 1 tablet (20 mg total) by mouth daily. 04/25/19 07/24/19  Antonieta Iba, MD    Allergies    Fish allergy  Review of Systems   Review of Systems  Musculoskeletal:  Positive for back pain.  All other systems reviewed and are negative.  Physical Exam Updated Vital Signs BP 125/81 (BP Location: Right Arm)   Pulse 68   Temp 98.7 F (37.1 C) (Oral)   Resp 16   Ht 6\' 2"  (1.88 m)   Wt 86.2 kg   SpO2 99%   BMI 24.39 kg/m   Physical Exam Vitals and nursing note reviewed.  Constitutional:      Appearance: He is well-developed.  HENT:     Head: Normocephalic and atraumatic.  Cardiovascular:     Rate and Rhythm: Normal rate and regular rhythm.     Heart sounds: No murmur heard. Pulmonary:     Effort: Pulmonary effort is normal. No respiratory distress.     Breath sounds: Normal breath sounds.     Comments: Tenderness to palpation over the upper back, predominantly over the right scapular region. There is no overlying rash or erythema. Abdominal:     Palpations: Abdomen is soft.     Tenderness: There is no abdominal tenderness. There is no guarding or rebound.  Musculoskeletal:        General: No swelling or tenderness.  Skin:    General: Skin is warm and dry.  Neurological:     Mental Status: He is alert and oriented to person, place, and time.  Psychiatric:        Behavior: Behavior normal.    ED Results / Procedures / Treatments   Labs (all labs ordered are listed, but only abnormal results are displayed) Labs Reviewed - No data to display  EKG EKG Interpretation  Date/Time:  Wednesday July 31 2021 03:08:21 EDT Ventricular Rate:  58 PR Interval:  130 QRS  Duration: 96 QT Interval:  414 QTC Calculation: 407 R Axis:   85 Text Interpretation: Sinus rhythm ST elev, probable normal early repol pattern No significant change since last tracing Confirmed by 12-08-1992 [phone]) on 07/31/2021 3:21:43 AM  Radiology DG Chest 2 View  Result Date: 07/31/2021 CLINICAL DATA:  Chest pain. EXAM: CHEST - 2 VIEW COMPARISON:  Chest radiograph dated 12/25/2020. FINDINGS: No focal consolidation, pleural effusion, or pneumothorax. The cardiac silhouette is within normal limits. No acute osseous pathology. IMPRESSION: No active cardiopulmonary disease. Electronically  Signed   By: Elgie Collard M.D.   On: 07/31/2021 02:49    Procedures Procedures   Medications Ordered in ED Medications - No data to display  ED Course  I have reviewed the triage vital signs and the nursing notes.  Pertinent labs & imaging results that were available during my care of the patient were reviewed by me and considered in my medical decision making (see chart for details).    MDM Rules/Calculators/A&P                          patient here for evaluation of bilateral upper back pain, right greater than left. Symptoms of been there for two days. He is on day 12 of COVID-19 infection and states that overall it feels like it's getting better in terms of his COVID symptoms. He does have pain with movement as well is with breathing. Lungs are clear on evaluation with no respiratory distress. Imaging is negative for pneumonia. Pain is reproducible on examination. Current clinical picture is not consistent with PE, pneumonia, ACS. He is perk negative. Discussed with patient home care for muscle pain/spasm. Discussed outpatient follow-up and return precautions.  Final Clinical Impression(s) / ED Diagnoses Final diagnoses:  Muscle spasm of back    Rx / DC Orders ED Discharge Orders          Ordered    cyclobenzaprine (FLEXERIL) 10 MG tablet  2 times daily PRN        07/31/21 0328              Tilden Fossa, MD 07/31/21 0330

## 2021-07-31 NOTE — ED Triage Notes (Signed)
Pt reports bilateral upper back pain and wants to be evaluated for pna due to being on day of 11 post covid +. Denies SOB, no chest pain but pain with inspiration.

## 2021-07-31 NOTE — Telephone Encounter (Signed)
Nurse Assessment Nurse: Elesa Hacker, RN, Nash Dimmer Date/Time Lamount Cohen Time): 07/30/2021 9:30:40 AM Confirm and document reason for call. If symptomatic, describe symptoms. ---Caller states he is on day 10 of covid and has a fever and has a pain in his back and abdomen when he breaths, a sharp dull pain and shortness of breath. Caller has had a temp on and off. Caller advised that yesterday he started having a pain his back. Does the patient have any new or worsening symptoms? ---Yes Will a triage be completed? ---Yes Related visit to physician within the last 2 weeks? ---No Does the PT have any chronic conditions? (i.e. diabetes, asthma, this includes High risk factors for pregnancy, etc.) ---Yes List chronic conditions. ---Deviated septum Is this a behavioral health or substance abuse call? ---No Guidelines Guideline Title Affirmed Question Affirmed Notes Nurse Date/Time (Eastern Time) COVID-19 - Diagnosed or Suspected SEVERE or constant chest pain or pressure (Exception: Mild central chest pain, Deaton, RN, Nash Dimmer 07/30/2021 9:32:19 AM PLEASE NOTE: All timestamps contained within this report are represented as [place] Standard Time. CONFIDENTIALTY NOTICE: This fax transmission is intended only for the addressee. It contains information that is legally privileged, confidential or otherwise protected from use or disclosure. If you are not the intended recipient, you are strictly prohibited from reviewing, disclosing, copying using or disseminating any of this information or taking any action in reliance on or regarding this information. If you have received this fax in error, please notify us immediately by telephone so that we can arrange for its return to [place]. Phone: [phone], Toll-Free: [phone], Fax: [phone] Page: 2 of 2 Call Id: [id number] Guidelines Guideline Title Affirmed Question Affirmed Notes Nurse Date/Time Lamount Cohen Time) present only when coughing.) Disp. Time  Lamount Cohen Time) Disposition Final User 07/30/2021 9:27:11 AM Send to Urgent Queue Emeline Gins 07/30/2021 9:35:41 AM Go to ED Now Yes Deaton, RN, Cory Roughen Disagree/Comply Comply Caller Understands Yes PreDisposition Did not know what to do Care Advice Given Per Guideline GO TO ED NOW: * You need to be seen in the Emergency Department. * Go to the ED at ___________ Hospital. * Leave now. Drive carefully. ANOTHER ADULT SHOULD DRIVE: * It is better and safer if another adult drives instead of you. CALL EMS 911 IF: * Severe difficulty breathing occurs * Lips or face turns blue * Confusion occurs. CARE ADVICE given per COVID-19 - DIAGNOSED OR SUSPECTED (Adult) guideline. Comments User: Wandra Scot, RN Date/Time Lamount Cohen Time): 07/30/2021 9:32:46 AM Caller is a smoker, long time , per caller. Has had 2 vaccines, no boosters. Referrals GO TO FACILITY OTHER - SPECIFY  Pt seen at ED.

## 2021-08-28 DIAGNOSIS — J343 Hypertrophy of nasal turbinates: Secondary | ICD-10-CM | POA: Diagnosis not present

## 2021-08-28 DIAGNOSIS — J328 Other chronic sinusitis: Secondary | ICD-10-CM | POA: Diagnosis not present

## 2021-08-28 DIAGNOSIS — J342 Deviated nasal septum: Secondary | ICD-10-CM | POA: Diagnosis not present

## 2021-08-28 DIAGNOSIS — J324 Chronic pansinusitis: Secondary | ICD-10-CM | POA: Diagnosis not present

## 2021-08-28 DIAGNOSIS — J3489 Other specified disorders of nose and nasal sinuses: Secondary | ICD-10-CM | POA: Diagnosis not present

## 2021-09-05 DIAGNOSIS — J3489 Other specified disorders of nose and nasal sinuses: Secondary | ICD-10-CM | POA: Diagnosis not present

## 2021-09-05 DIAGNOSIS — Z48813 Encounter for surgical aftercare following surgery on the respiratory system: Secondary | ICD-10-CM | POA: Diagnosis not present

## 2021-09-05 DIAGNOSIS — Z9889 Other specified postprocedural states: Secondary | ICD-10-CM | POA: Diagnosis not present

## 2022-07-09 ENCOUNTER — Ambulatory Visit (HOSPITAL_BASED_OUTPATIENT_CLINIC_OR_DEPARTMENT_OTHER)
Admission: RE | Admit: 2022-07-09 | Discharge: 2022-07-09 | Disposition: A | Discharge status: Home / Self Care | Payer: Federal, State, Local not specified - PPO | Source: Ambulatory Visit | Attending: Medical | Admitting: Medical

## 2022-07-09 ENCOUNTER — Ambulatory Visit: Payer: Federal, State, Local not specified - PPO | Admitting: Medical

## 2022-07-09 VITALS — BP 128/80 | HR 63 | Resp 18 | Ht 74.0 in | Wt 189.4 lb

## 2022-07-09 DIAGNOSIS — M546 Pain in thoracic spine: Secondary | ICD-10-CM | POA: Insufficient documentation

## 2022-07-09 DIAGNOSIS — K921 Melena: Secondary | ICD-10-CM

## 2022-07-09 DIAGNOSIS — R350 Frequency of micturition: Secondary | ICD-10-CM

## 2022-07-09 DIAGNOSIS — R911 Solitary pulmonary nodule: Secondary | ICD-10-CM | POA: Diagnosis not present

## 2022-07-09 DIAGNOSIS — J302 Other seasonal allergic rhinitis: Secondary | ICD-10-CM

## 2022-07-09 DIAGNOSIS — Z0001 Encounter for general adult medical examination with abnormal findings: Secondary | ICD-10-CM | POA: Diagnosis not present

## 2022-07-09 DIAGNOSIS — R079 Chest pain, unspecified: Secondary | ICD-10-CM | POA: Diagnosis not present

## 2022-07-09 DIAGNOSIS — R5383 Other fatigue: Secondary | ICD-10-CM

## 2022-07-09 DIAGNOSIS — R0781 Pleurodynia: Secondary | ICD-10-CM

## 2022-07-09 DIAGNOSIS — R0789 Other chest pain: Secondary | ICD-10-CM | POA: Diagnosis not present

## 2022-07-09 DIAGNOSIS — Z1283 Encounter for screening for malignant neoplasm of skin: Secondary | ICD-10-CM

## 2022-07-09 DIAGNOSIS — Z125 Encounter for screening for malignant neoplasm of prostate: Secondary | ICD-10-CM | POA: Diagnosis not present

## 2022-07-09 DIAGNOSIS — Z1211 Encounter for screening for malignant neoplasm of colon: Secondary | ICD-10-CM

## 2022-07-09 DIAGNOSIS — Z0000 Encounter for general adult medical examination without abnormal findings: Secondary | ICD-10-CM

## 2022-07-09 LAB — CBC WITH DIFFERENTIAL/PLATELET
Basophils Absolute: 0.1 10*3/uL (ref 0.0–0.1)
Basophils Relative: 0.7 % (ref 0.0–3.0)
Eosinophils Absolute: 0.4 10*3/uL (ref 0.0–0.7)
Eosinophils Relative: 3.3 % (ref 0.0–5.0)
HCT: 50.9 % (ref 39.0–52.0)
Hemoglobin: 17 g/dL (ref 13.0–17.0)
Lymphocytes Relative: 24.7 % (ref 12.0–46.0)
Lymphs Abs: 2.9 10*3/uL (ref 0.7–4.0)
MCHC: 33.5 g/dL (ref 30.0–36.0)
MCV: 96.3 fl (ref 78.0–100.0)
Monocytes Absolute: 0.9 10*3/uL (ref 0.1–1.0)
Monocytes Relative: 7.9 % (ref 3.0–12.0)
Neutro Abs: 7.5 10*3/uL (ref 1.4–7.7)
Neutrophils Relative %: 63.4 % (ref 43.0–77.0)
Platelets: 195 10*3/uL (ref 150.0–400.0)
RBC: 5.28 Mil/uL (ref 4.22–5.81)
RDW: 13.1 % (ref 11.5–15.5)
WBC: 11.9 10*3/uL — ABNORMAL HIGH (ref 4.0–10.5)

## 2022-07-09 LAB — COMPREHENSIVE METABOLIC PANEL
ALT: 18 U/L (ref 0–53)
AST: 17 U/L (ref 0–37)
Albumin: 4.7 g/dL (ref 3.5–5.2)
Alkaline Phosphatase: 66 U/L (ref 39–117)
BUN: 10 mg/dL (ref 6–23)
CO2: 26 mEq/L (ref 19–32)
Calcium: 9.9 mg/dL (ref 8.4–10.5)
Chloride: 104 mEq/L (ref 96–112)
Creatinine, Ser: 0.99 mg/dL (ref 0.40–1.50)
GFR: 89.39 mL/min (ref >60.00)
Glucose, Bld: 90 mg/dL (ref 70–99)
Potassium: 4 mEq/L (ref 3.5–5.1)
Sodium: 139 mEq/L (ref 135–145)
Total Bilirubin: 0.4 mg/dL (ref 0.2–1.2)
Total Protein: 7.6 g/dL (ref 6.0–8.3)

## 2022-07-09 LAB — LIPID PANEL
Cholesterol: 190 mg/dL (ref 0–200)
HDL: 36.7 mg/dL — ABNORMAL LOW (ref >39.00)
LDL Cholesterol: 141 mg/dL — ABNORMAL HIGH (ref 0–99)
NonHDL: 153.11
Total CHOL/HDL Ratio: 5
Triglycerides: 61 mg/dL (ref 0.0–149.0)
VLDL: 12.2 mg/dL (ref 0.0–40.0)

## 2022-07-09 LAB — TROPONIN I (HIGH SENSITIVITY): High Sens Troponin I: 3 ng/L (ref 2–17)

## 2022-07-09 LAB — T4, FREE: Free T4: 0.9 ng/dL (ref 0.60–1.60)

## 2022-07-09 LAB — PSA: PSA: 1.37 ng/mL (ref 0.10–4.00)

## 2022-07-09 LAB — TSH: TSH: 0.96 u[IU]/mL (ref 0.35–5.50)

## 2022-07-09 LAB — VITAMIN B12: Vitamin B-12: 317 pg/mL (ref 211–911)

## 2022-07-09 MED ORDER — METHYLPREDNISOLONE 4 MG PO TABS
ORAL_TABLET | ORAL | 0 refills | Status: DC
Start: 2022-07-09 — End: 2022-07-17

## 2022-07-09 NOTE — Progress Notes (Signed)
Subjective:    Patient ID: Mark Duncan, male    DOB: [birth year], 51 y.o.   MRN: [id number]  HPI  Pt in for follow up.  Pt scheduled this as yearly exam today. 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 No regular exercise. 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 No cigarrete smoking. Smokes marijuana. Very rare alcohol.     Placed screening colonoscopy today. Screening psa.    Declined tdap today. Can get later.   Pt updates me he had deviated septum surgery.  Pt states recent upper chest pressure and upper back area pain for 3 days. He states left upper back pain for 3 days. The left upper chest pain for one day. Pt is a smoker. Smoked for marijuana 6 joints a day.  Pt allergy symptoms have been worse. Pt also has deviated septum. He had surgery for that in past year. Pt is on claritin.   Pt has hx of chronic sinusitis. Symptoms are not improved with much since surgery.   He states feels some pressure building since sinus surgery. When he presses on left maxillary sinus area he describes crepitus sound.  Dr. Betsey Amen in sept 2022 advised saline nasal srary and flonase. Pt states he was told not to use flonase?   Pt wife states he is loosing weight. Pt has some nausea. Pt weight in 05-09-2020 was 194 lb. Pt states less appetitie due to alergies. Wt today 189.4.    Review of Systems  Constitutional:  Positive for fatigue. Negative for chills and fever.  HENT:  Positive for congestion.   Respiratory:  Negative for cough, chest tightness, shortness of breath and wheezing.   Cardiovascular:  Negative for chest pain and palpitations.  Gastrointestinal:  Negative for abdominal pain, blood in stool, diarrhea, rectal pain and vomiting.       When wipes will get some bright red blood .some itching and burning to rectum. Pt saw some bright red blood when he felt constipated. This was June 22, 2022. Only bright red blood for one day.  Genitourinary:  Negative for dysuria, flank  pain and frequency.  Musculoskeletal:  Negative for back pain, myalgias and neck pain.  Skin:  Negative for pallor and rash.    Past Medical History:  Diagnosis Date   Blood transfusion without reported diagnosis    GERD (gastroesophageal reflux disease)    Hyperlipidemia      Social History   Socioeconomic History   Marital status: Married    Spouse name: Not on file   Number of children: Not on file   Years of education: Not on file   Highest education level: Not on file  Occupational History   Not on file  Tobacco Use   Smoking status: Light Smoker    Types: Cigarettes   Smokeless tobacco: Former  Building services engineer Use: Never used  Substance and Sexual Activity   Alcohol use: No    Alcohol/week: 0.0 standard drinks of alcohol   Drug use: Yes    Types: Marijuana   Sexual activity: Not on file  Other Topics Concern   Not on file  Social History Narrative   Not on file   Social Determinants of Health   Financial Resource Strain: Not on file  Food Insecurity: Not on file  Transportation Needs: Not on file  Physical Activity: Not on file  Stress: Not on file  Social Connections: Not on file  Intimate Partner Violence: Not on file  Past Surgical History:  Procedure Laterality Date   HAND SURGERY     Glass removed   KNEE ARTHROSCOPY W/ MENISCAL REPAIR      Family History  Problem Relation Age of Onset   Heart disease Maternal Uncle    Heart disease Maternal Grandmother     Allergies  Allergen Reactions   Fish Allergy Hives, Itching and Swelling    Grouper Only    No current outpatient medications on file prior to visit.   No current facility-administered medications on file prior to visit.    BP 128/80   Pulse 63   Resp 18   Ht 6\' 2"  (1.88 m)   Wt 189 lb 6.4 oz (85.9 kg)   SpO2 97%   BMI 24.32 kg/m        Objective:   Physical Exam  General Mental Status- Alert. General Appearance- Not in acute distress.   Skin Scattered dark  mole on back, left shoulder and mid axillary rib area.  Neck Carotid Arteries- Normal color. Moisture- Normal Moisture. No carotid bruits. No JVD.  Chest and Lung Exam Auscultation: Breath Sounds:-Normal.  Cardiovascular Auscultation:Rythm- Regular. Murmurs & Other Heart Sounds:Auscultation of the heart reveals- No Murmurs.  Abdomen Inspection:-Inspeection Normal. Palpation/Percussion:Note:No mass. Palpation and Percussion of the abdomen reveal- Non Tender, Non Distended + BS, no rebound or guarding.   Neurologic Cranial Nerve exam:- CN III-XII intact(No nystagmus), symmetric smile. Strength:- 5/5 equal and symmetric strength both upper and lower extremities.   Back- left upper back. Border of scapula. Mild-modeate tender to palpation Chest- upper chest belwo scapula tender to palpation.     Assessment & Plan:   Patient Instructions  For you wellness exam today I have ordered cbc, cmp and  lipid panel.  For fatigue will get tsh, t4, b12 and b1.  Vaccine tdap declined.  Referred to gi MD for colonoscopy and to evaluate reason for bright red blood when wiped in July. No hemorrhoid presently.  Recommend exercise and healthy diet.  We will let you know lab results as they come in.  Follow up date appointment will be determined after lab review.    Lung nodule- small 4 mm. Report stated could repeat in one year. So placed that order. No cigarette smoking but daly marijuana.  Upper back and upper chest pain- will get cxr, ekg today(sinus bardycardia at 56 after prolonged sitting) and one set troponin. I think pain msk in origin. Will rx 6 day taper medrol. If pain worsens or changes be seen in ED. Former work up   Allergies- continue claritin and can add on flonase. Medrol may help. Placed referral back to allergist. Sinus pressure. If does not get better with medrol then add on zpack.  Frequent urination for years. Get psa for screening purposes and to evaulate for  frequency.  Mild wt loss. Follow colonscopy, psa and ct chest if authorized. I think benign wt loss but will follow. Consider ct abd if more wt loss.  Follow up 2 weeks or sooner if needed.         August, [name]   [place] charge in addition to wellness exam.  I did address on lung nodule, upper back pain, atypical chest pain, allergies, faitgue sinus pressure, frequent urination and weight loss concerns.

## 2022-07-09 NOTE — Patient Instructions (Addendum)
For you wellness exam today I have ordered cbc, cmp and  lipid panel.  For fatigue will get tsh, t4, b12 and b1.  Vaccine tdap declined.  Referred to gi MD for colonoscopy and to evaluate reason for bright red blood when wiped in July. No hemorrhoid presently.  Recommend exercise and healthy diet.  We will let you know lab results as they come in.  Follow up date appointment will be determined after lab review.    Lung nodule- small 4 mm. Report stated could repeat in one year. So placed that order. No cigarette smoking but daly marijuana.  Upper back and upper chest pain- will get cxr, ekg today(sinus bardycardia at 56 after prolonged sitting) and one set troponin. I think pain msk in origin. Will rx 6 day taper medrol. If pain worsens or changes be seen in ED. Former work up   Allergies- continue claritin and can add on flonase. Medrol may help. Placed referral back to allergist. Sinus pressure. If does not get better with medrol then add on zpack.  Frequent urination for years. Get psa for screening purposes and to evaulate for frequency.  Mild wt loss. Follow colonscopy, psa and ct chest if authorized. I think benign wt loss but will follow. Consider ct abd if more wt loss.  For fatigue tsh, t4 and b12 level.  Follow up 2 weeks or sooner if needed.   Preventive Care 61-34 Years Old, Male Preventive care refers to lifestyle choices and visits with your health care provider that can promote health and wellness. Preventive care visits are also called wellness exams. What can I expect for my preventive care visit? Counseling During your preventive care visit, your health care provider may ask about your: Medical history, including: Past medical problems. Family medical history. Current health, including: Emotional well-being. Home life and relationship well-being. Sexual activity. Lifestyle, including: Alcohol, nicotine or tobacco, and drug use. Access to firearms. Diet,  exercise, and sleep habits. Safety issues such as seatbelt and bike helmet use. Sunscreen use. Work and work Astronomer. Physical exam Your health care provider will check your: Height and weight. These may be used to calculate your BMI (body mass index). BMI is a measurement that tells if you are at a healthy weight. Waist circumference. This measures the distance around your waistline. This measurement also tells if you are at a healthy weight and may help predict your risk of certain diseases, such as type 2 diabetes and high blood pressure. Heart rate and blood pressure. Body temperature. Skin for abnormal spots. What immunizations do I need?  Vaccines are usually given at various ages, according to a schedule. Your health care provider will recommend vaccines for you based on your age, medical history, and lifestyle or other factors, such as travel or where you work. What tests do I need? Screening Your health care provider may recommend screening tests for certain conditions. This may include: Lipid and cholesterol levels. Diabetes screening. This is done by checking your blood sugar (glucose) after you have not eaten for a while (fasting). Hepatitis B test. Hepatitis C test. HIV (human immunodeficiency virus) test. STI (sexually transmitted infection) testing, if you are at risk. Lung cancer screening. Prostate cancer screening. Colorectal cancer screening. Talk with your health care provider about your test results, treatment options, and if necessary, the need for more tests. Follow these instructions at home: Eating and drinking  Eat a diet that includes fresh fruits and vegetables, whole grains, lean protein, and low-fat dairy  products. Take vitamin and mineral supplements as recommended by your health care provider. Do not drink alcohol if your health care provider tells you not to drink. If you drink alcohol: Limit how much you have to 0-2 drinks a day. Know how much  alcohol is in your drink. In the [place] one drink equals one 12 oz bottle of beer (355 mL), one 5 oz glass of wine (148 mL), or one 1 oz glass of hard liquor (44 mL). Lifestyle Brush your teeth every morning and night with fluoride toothpaste. Floss one time each day. Exercise for at least 30 minutes 5 or more days each week. Do not use any products that contain nicotine or tobacco. These products include cigarettes, chewing tobacco, and vaping devices, such as e-cigarettes. If you need help quitting, ask your health care provider. Do not use drugs. If you are sexually active, practice safe sex. Use a condom or other form of protection to prevent STIs. Take aspirin only as told by your health care provider. Make sure that you understand how much to take and what form to take. Work with your health care provider to find out whether it is safe and beneficial for you to take aspirin daily. Find healthy ways to manage stress, such as: Meditation, yoga, or listening to music. Journaling. Talking to a trusted person. Spending time with friends and family. Minimize exposure to UV radiation to reduce your risk of skin cancer. Safety Always wear your seat belt while driving or riding in a vehicle. Do not drive: If you have been drinking alcohol. Do not ride with someone who has been drinking. When you are tired or distracted. While texting. If you have been using any mind-altering substances or drugs. Wear a helmet and other protective equipment during sports activities. If you have firearms in your house, make sure you follow all gun safety procedures. What's next? Go to your health care provider once a year for an annual wellness visit. Ask your health care provider how often you should have your eyes and teeth checked. Stay up to date on all vaccines. This information is not intended to replace advice given to you by your health care provider. Make sure you discuss any questions you have with  your health care provider. Document Revised: 05/22/2021 Document Reviewed: 05/22/2021 Elsevier Patient Education  2023 [employer].

## 2022-07-10 ENCOUNTER — Telehealth: Payer: Self-pay | Admitting: Medical

## 2022-07-10 NOTE — Telephone Encounter (Signed)
Pt is wanting a z-pack sent in to help with sinus pressure.    [facility] [id number] [place], [place] - [address]   [address] [place], [place] [address]  Phone:  [phone]  Fax:  [phone]

## 2022-07-11 MED ORDER — AZITHROMYCIN 250 MG PO TABS: ORAL_TABLET | ORAL | 0 refills | Status: AC

## 2022-07-11 NOTE — Telephone Encounter (Signed)
Pt notified  Pt stated he has been having abd pain with cramps , made him aware that he should eat before taking antibiotic , and told him to give [place] an update after starting the zpak

## 2022-07-11 NOTE — Telephone Encounter (Signed)
Nurse Assessment Nurse: Surprenant, RN, Monique Date/Time (Eastern Time): 07/10/2022 6:15:54 PM Confirm and document reason for call. If symptomatic, describe symptoms. ---Caller states their husband was rx'ed a z-pack but it was never sent to the pharmacy. Saw provider yesterday. He has had recent weight loss, sinus pressure, and no appetite with nausea. Blood work came back with WBC are high and provider said he could call in a an antibx. [facility] [address]. Does the patient have any new or worsening symptoms? ---Yes Will a triage be completed? ---Yes Related visit to physician within the last 2 weeks? ---Yes Does the PT have any chronic conditions? (i.e. diabetes, asthma, this includes High risk factors for pregnancy, etc.) ---No Is this a behavioral health or substance abuse call? ---No Guidelines Guideline Title Affirmed Question Affirmed Notes Nurse Date/Time Lamount Cohen Time) Recent Medical Visit for Illness Follow-up Call [1] Recent medical visit within 24 hours AND [2] NEW symptom AND [3] that could be serious Trenton Gammon, RN, Monique 07/10/2022 6:18:47 PM PLEASE NOTE: All timestamps contained within this report are represented as [place] Standard Time. CONFIDENTIALTY NOTICE: This fax transmission is intended only for the addressee. It contains information that is legally privileged, confidential or otherwise protected from use or disclosure. If you are not the intended recipient, you are strictly prohibited from reviewing, disclosing, copying using or disseminating any of this information or taking any action in reliance on or regarding this information. If you have received this fax in error, please notify us immediately by telephone so that we can arrange for its return to [place]. Phone: [phone], Toll-Free: [phone], Fax: [phone] Page: 2 of 2 Call Id: [id number] Disp. Time Lamount Cohen Time) Disposition Final User 07/10/2022 6:21:04 PM Call  PCP Now Yes Surprenant, RN, Gabriel Rung 07/10/2022 6:28:58 PM Called On-Call Provider Surprenant, RN, Monique Final Disposition 07/10/2022 6:21:04 PM Call PCP Now Yes Trenton Gammon, RN, Gabriel Rung Caller Disagree/Comply Comply Caller Understands Yes PreDisposition Call Doctor Care Advice Given Per Guideline CALL PCP NOW: * I'll page the on-call provider now. If you haven't heard from the provider (or me) within 30 minutes, call again. CALL BACK IF: * You become worse Comments User: Lawson Radar, RN Date/Time (Eastern Time): 07/10/2022 6:30:29 PM Triager instructed caller to call the ofc in am to ask for pts PCP to prescribe the antibx. Caller verbalized understanding. Paging DoctorName Phone DateTime Result/ Outcome Message Type Notes Cheryll Cockayne - MD [phone] 07/10/2022 6:28:58 PM Called On Call Provider - Reached Doctor Paged Cheryll Cockayne - MD 07/10/2022 6:29:46 PM Spoke with On Call - General Message Result on call provider would like for pt to call [facility] in am to get zpack prescribed

## 2022-07-11 NOTE — Telephone Encounter (Signed)
Patient's wife called to follow up on his z pack prescription and the fact that the methylPREDNISolone (MEDROL) 4 MG tablet [[id number]]  is causing some cramping in his stomach. Advised that the prescription was sent to [facility] in [place]. Please call patient back to advise on whether he can discontinue the methylPREDNISolone (MEDROL) 4 MG tablet [[id number]] .

## 2022-07-11 NOTE — Addendum Note (Signed)
Addended by: Gwenevere Abbot on: 07/11/2022 06:43 AM   Modules accepted: Orders

## 2022-07-16 ENCOUNTER — Encounter: Payer: Self-pay | Admitting: Medical

## 2022-07-16 NOTE — Telephone Encounter (Signed)
Pt's wife called to check on the status of a mychart message that was sent today to PCP. Informed patient that Ramon Dredge has been with patients but as soon as he's able to, he will respond to them.

## 2022-07-17 ENCOUNTER — Ambulatory Visit: Payer: Federal, State, Local not specified - PPO | Admitting: Medical

## 2022-07-17 ENCOUNTER — Encounter: Payer: Self-pay | Admitting: Medical

## 2022-07-17 VITALS — BP 110/60 | HR 74 | Temp 98.2°F | Ht 73.0 in | Wt 182.6 lb

## 2022-07-17 DIAGNOSIS — R110 Nausea: Secondary | ICD-10-CM

## 2022-07-17 DIAGNOSIS — R1013 Epigastric pain: Secondary | ICD-10-CM

## 2022-07-17 LAB — CBC WITH DIFFERENTIAL/PLATELET
Basophils Absolute: 0.2 10*3/uL — ABNORMAL HIGH (ref 0.0–0.1)
Basophils Relative: 1.5 % (ref 0.0–3.0)
Eosinophils Absolute: 0.2 10*3/uL (ref 0.0–0.7)
Eosinophils Relative: 1.6 % (ref 0.0–5.0)
HCT: 50.2 % (ref 39.0–52.0)
Hemoglobin: 16.7 g/dL (ref 13.0–17.0)
Lymphocytes Relative: 28.3 % (ref 12.0–46.0)
Lymphs Abs: 2.9 10*3/uL (ref 0.7–4.0)
MCHC: 33.3 g/dL (ref 30.0–36.0)
MCV: 96.5 fl (ref 78.0–100.0)
Monocytes Absolute: 0.9 10*3/uL (ref 0.1–1.0)
Monocytes Relative: 8.9 % (ref 3.0–12.0)
Neutro Abs: 6.1 10*3/uL (ref 1.4–7.7)
Neutrophils Relative %: 59.7 % (ref 43.0–77.0)
Platelets: 205 10*3/uL (ref 150.0–400.0)
RBC: 5.2 Mil/uL (ref 4.22–5.81)
RDW: 13.1 % (ref 11.5–15.5)
WBC: 10.2 10*3/uL (ref 4.0–10.5)

## 2022-07-17 LAB — COMPREHENSIVE METABOLIC PANEL
ALT: 12 U/L (ref 0–53)
AST: 15 U/L (ref 0–37)
Albumin: 4.5 g/dL (ref 3.5–5.2)
Alkaline Phosphatase: 65 U/L (ref 39–117)
BUN: 12 mg/dL (ref 6–23)
CO2: 30 mEq/L (ref 19–32)
Calcium: 10 mg/dL (ref 8.4–10.5)
Chloride: 101 mEq/L (ref 96–112)
Creatinine, Ser: 1.08 mg/dL (ref 0.40–1.50)
GFR: 80.51 mL/min (ref >60.00)
Glucose, Bld: 77 mg/dL (ref 70–99)
Potassium: 4.4 mEq/L (ref 3.5–5.1)
Sodium: 137 mEq/L (ref 135–145)
Total Bilirubin: 0.4 mg/dL (ref 0.2–1.2)
Total Protein: 7.4 g/dL (ref 6.0–8.3)

## 2022-07-17 LAB — H. PYLORI ANTIBODY, IGG: H Pylori IgG: POSITIVE — AB

## 2022-07-17 LAB — LIPASE: Lipase: 19 U/L (ref 11.0–59.0)

## 2022-07-17 MED ORDER — FAMOTIDINE 20 MG PO TABS: 20.0000 mg | ORAL_TABLET | Freq: Two times a day (BID) | ORAL | 0 refills | Status: DC

## 2022-07-17 NOTE — Addendum Note (Signed)
Addended by: Gwenevere Abbot on: 07/17/2022 06:26 PM   Modules accepted: Orders

## 2022-07-17 NOTE — Patient Instructions (Signed)
Recent severe abd pain that occurred after using prednisone for allergies and eating high amount of unhealthy foods. No h2 blocker/famotadine for 2 weeks. Recent restarted and immediate relief.  I do think severe flare gerd.  Will have you start back on famotadine twice daily.   Will get cbc, cmp and lipase state. [place] abd stat. Include h pylori igg.  Follow  up on 07/23/2022 or sooner if needed.  If any severe abdomen pain despite the above. If any chest pain as well be seen in ED.

## 2022-07-17 NOTE — Progress Notes (Addendum)
Subjective:    Patient ID: Mark Duncan, male    DOB: October 07, 1971, 51 y.o.   MRN: [id number]  HPI  Pt in for follow up.  Pt states he has been having a lot of upset stomach, heart burn and had belching. Pt wandered if maybe was gallbdder and his wife wonders if was his pancrease.  Night before last was feeling nausea.   Pt states he continue to belch. Pt also notes he stopped zantac 2 weeks ago. He states last night he did take zantac and he symptoms.   He states ate huge supper last night and no symptoms. Had salad, can of soup and some rosssary chicken.  Pt states he has not been eating much after last visit 8 days ago.  He states after he started prednisone he began to eat a lot. For about 2 days admitted was eating a lot of junk foods. Then about one week ago got real upset stomach.   Pt is belching presently on exam.    Review of Systems  Constitutional:  Negative for chills, fatigue and fever.  Respiratory:  Negative for cough, chest tightness, shortness of breath and wheezing.   Cardiovascular:  Negative for chest pain and palpitations.  Gastrointestinal:  Positive for abdominal pain and nausea. Negative for abdominal distention, constipation, diarrhea and vomiting.  Genitourinary:  Negative for dysuria and flank pain.  Musculoskeletal:  Negative for back pain, myalgias and neck pain.  Skin:  Negative for rash.  Neurological:  Negative for dizziness and light-headedness.  Psychiatric/Behavioral:  Negative for behavioral problems, dysphoric mood and suicidal ideas. The patient is not nervous/anxious.    Past Medical History:  Diagnosis Date   Blood transfusion without reported diagnosis    GERD (gastroesophageal reflux disease)    Hyperlipidemia      Social History   Socioeconomic History   Marital status: Married    Spouse name: Not on file   Number of children: Not on file   Years of education: Not on file   Highest education level: Not on file   Occupational History   Not on file  Tobacco Use   Smoking status: Light Smoker    Types: Cigarettes   Smokeless tobacco: Former  Building services engineer Use: Never used  Substance and Sexual Activity   Alcohol use: No    Alcohol/week: 0.0 standard drinks of alcohol   Drug use: Yes    Types: Marijuana   Sexual activity: Not on file  Other Topics Concern   Not on file  Social History Narrative   Not on file   Social Determinants of Health   Financial Resource Strain: Not on file  Food Insecurity: Not on file  Transportation Needs: Not on file  Physical Activity: Not on file  Stress: Not on file  Social Connections: Not on file  Intimate Partner Violence: Not on file    Past Surgical History:  Procedure Laterality Date   HAND SURGERY     Glass removed   KNEE ARTHROSCOPY W/ MENISCAL REPAIR      Family History  Problem Relation Age of Onset   Heart disease Maternal Uncle    Heart disease Maternal Grandmother     Allergies  Allergen Reactions   Fish Allergy Hives, Itching and Swelling    Grouper Only    Current Outpatient Medications on File Prior to Visit  Medication Sig Dispense Refill   loratadine-pseudoephedrine (CLARITIN-D 24-HOUR) 10-240 MG 24 hr tablet Take 1 tablet by  mouth daily.     No current facility-administered medications on file prior to visit.    BP 110/60   Pulse 74   Temp 98.2 F (36.8 C) (Oral)   Ht 6\' 1"  (1.854 m)   Wt 182 lb 9.6 oz (82.8 kg)   SpO2 95%   BMI 24.09 kg/m        Objective:   Physical Exam  General Mental Status- Alert. General Appearance- Not in acute distress.   Skin General: Color- Normal Color. Moisture- Normal Moisture.  Neck Carotid Arteries- Normal color. Moisture- Normal Moisture. No carotid bruits. No JVD.  Chest and Lung Exam Auscultation: Breath Sounds:-Normal.  Cardiovascular Auscultation:Rythm- Regular. Murmurs & Other Heart Sounds:Auscultation of the heart reveals- No  Murmurs.  Abdomen Inspection:-Inspeection Normal. Palpation/Percussion:Note:No mass. Palpation and Percussion of the abdomen reveal- mild epigastric/luq Tender, Non Distended + BS, no rebound or guarding.   Neurologic Cranial Nerve exam:- CN III-XII intact(No nystagmus), symmetric smile. Strength:- 5/5 equal and symmetric strength both upper and lower extremities.       Assessment & Plan:   Patient Instructions  Recent severe abd pain that occurred after using prednisone for allergies and eating high amount of unhealthy foods. No h2 blocker/famotadine for 2 weeks. Recent restarted and immediate relief.  I do think severe flare gerd.  Will have you start back on famotadine twice daily.   Will get cbc, cmp and lipase state. abd stat. Include h pylori igg.  Follow  up on 07/23/2022 or sooner if needed.  If any severe abdomen pain despite the above. If any chest pain as well be seen in ED.   07/25/2022, PA-C   Some weight loss but not eating much past week. Will follow if not gaining back then expand work up.

## 2022-07-18 ENCOUNTER — Ambulatory Visit (HOSPITAL_BASED_OUTPATIENT_CLINIC_OR_DEPARTMENT_OTHER)
Admission: RE | Admit: 2022-07-18 | Discharge: 2022-07-18 | Disposition: A | Discharge status: Home / Self Care | Payer: Federal, State, Local not specified - PPO | Source: Ambulatory Visit | Attending: Medical | Admitting: Medical

## 2022-07-18 DIAGNOSIS — R1013 Epigastric pain: Secondary | ICD-10-CM | POA: Insufficient documentation

## 2022-07-18 DIAGNOSIS — R110 Nausea: Secondary | ICD-10-CM | POA: Diagnosis not present

## 2022-07-22 ENCOUNTER — Ambulatory Visit (HOSPITAL_BASED_OUTPATIENT_CLINIC_OR_DEPARTMENT_OTHER)
Admission: RE | Admit: 2022-07-22 | Discharge: 2022-07-22 | Disposition: A | Discharge status: Home / Self Care | Payer: Federal, State, Local not specified - PPO | Source: Ambulatory Visit | Attending: Medical | Admitting: Medical

## 2022-07-22 DIAGNOSIS — R911 Solitary pulmonary nodule: Secondary | ICD-10-CM | POA: Insufficient documentation

## 2022-07-23 ENCOUNTER — Ambulatory Visit (INDEPENDENT_AMBULATORY_CARE_PROVIDER_SITE_OTHER): Payer: Federal, State, Local not specified - PPO | Admitting: Medical

## 2022-07-23 VITALS — BP 105/65 | HR 65 | Temp 98.2°F | Resp 18 | Ht 73.0 in | Wt 185.4 lb

## 2022-07-23 DIAGNOSIS — K219 Gastro-esophageal reflux disease without esophagitis: Secondary | ICD-10-CM

## 2022-07-23 DIAGNOSIS — R1010 Upper abdominal pain, unspecified: Secondary | ICD-10-CM | POA: Diagnosis not present

## 2022-07-23 NOTE — Progress Notes (Signed)
Subjective:    Patient ID: Mark Duncan, male    DOB: December 25, 1970, 51 y.o.   MRN: [id number]  HPI  Pt in for follow up.  Pt states his stomach overall feels well. Only one episode of heart burn the other day after strombolli. But if he takes famotadine will control symptoms.  See prior work up.   He reminds me before famotadine was very senitive to various foods.  Work up was negative but had IgG h pylori. He does not remember h pylori infection. On review of labs I was about to rx antibiotics but saw hx of c dif so wanted to get h pylori breath test.    Review of Systems  Constitutional:  Negative for chills, fatigue and fever.  Respiratory:  Negative for cough, chest tightness, shortness of breath and wheezing.   Cardiovascular:  Negative for chest pain and palpitations.  Gastrointestinal:  Negative for abdominal distention, abdominal pain, blood in stool, diarrhea, nausea and vomiting.  Genitourinary:  Negative for dysuria and flank pain.  Musculoskeletal:  Negative for back pain.  Skin:  Negative for rash.  Neurological:  Negative for dizziness and headaches.    Past Medical History:  Diagnosis Date   Blood transfusion without reported diagnosis    GERD (gastroesophageal reflux disease)    Hyperlipidemia      Social History   Socioeconomic History   Marital status: Married    Spouse name: Not on file   Number of children: Not on file   Years of education: Not on file   Highest education level: Not on file  Occupational History   Not on file  Tobacco Use   Smoking status: Light Smoker    Types: Cigarettes   Smokeless tobacco: Former  Building services engineer Use: Never used  Substance and Sexual Activity   Alcohol use: No    Alcohol/week: 0.0 standard drinks of alcohol   Drug use: Yes    Types: Marijuana   Sexual activity: Not on file  Other Topics Concern   Not on file  Social History Narrative   Not on file   Social Determinants of Health    Financial Resource Strain: Not on file  Food Insecurity: Not on file  Transportation Needs: Not on file  Physical Activity: Not on file  Stress: Not on file  Social Connections: Not on file  Intimate Partner Violence: Not on file    Past Surgical History:  Procedure Laterality Date   HAND SURGERY     Glass removed   KNEE ARTHROSCOPY W/ MENISCAL REPAIR      Family History  Problem Relation Age of Onset   Heart disease Maternal Uncle    Heart disease Maternal Grandmother     Allergies  Allergen Reactions   Fish Allergy Hives, Itching and Swelling    Grouper Only    Current Outpatient Medications on File Prior to Visit  Medication Sig Dispense Refill   famotidine (PEPCID) 20 MG tablet Take 1 tablet (20 mg total) by mouth 2 (two) times daily. 60 tablet 0   loratadine-pseudoephedrine (CLARITIN-D 24-HOUR) 10-240 MG 24 hr tablet Take 1 tablet by mouth daily.     No current facility-administered medications on file prior to visit.    BP 105/65   Pulse 65   Temp 98.2 F (36.8 C)   Resp 18   Ht 6\' 1"  (1.854 m)   Wt 185 lb 6.4 oz (84.1 kg)   SpO2 97%   BMI  24.46 kg/m         Objective:   Physical Exam  General- No acute distress. Pleasant patient. Neck- Full range of motion, no jvd Lungs- Clear, even and unlabored. Heart- regular rate and rhythm. Neurologic- CNII- XII grossly intact.  Abdomen- soft, nt, nd, +bs, no rebound or guarding.  Back- no cva tenderness.         Assessment & Plan:   Patient Instructions  Genella Rife recently much improved with famotadine. Recommend eating healthy and continue famotadine.  With recent h pylori iga and hx of c dif in past do want to get h pylori breath test. Please call gi office to get scheduled for colonoscopy.  Please keep gi MD appointment.  Follow up 3 months or sooner if needed.      [name], PA-C

## 2022-07-23 NOTE — Patient Instructions (Addendum)
Mark Duncan recently much improved with famotadine. Recommend eating healthy and continue famotadine.  With recent h pylori iga and hx of c dif in past do want to get h pylori breath test. Please call gi office to get scheduled for colonoscopy.  Please keep gi MD appointment.  Follow up 3 months or sooner if needed.

## 2022-07-27 LAB — H. PYLORI BREATH TEST: H. pylori Breath Test: DETECTED — AB

## 2022-07-27 MED ORDER — AMOXICILLIN 875 MG PO TABS: 875.0000 mg | ORAL_TABLET | Freq: Two times a day (BID) | ORAL | 0 refills | Status: AC

## 2022-07-27 MED ORDER — CLARITHROMYCIN ER 500 MG PO TB24: 1000.0000 mg | ORAL_TABLET | Freq: Every day | ORAL | 0 refills | Status: DC

## 2022-07-27 MED ORDER — OMEPRAZOLE 20 MG PO CPDR: 20.0000 mg | DELAYED_RELEASE_CAPSULE | Freq: Every day | ORAL | 1 refills | Status: DC

## 2022-07-27 NOTE — Addendum Note (Signed)
Addended by: Gwenevere Abbot on: 07/27/2022 08:58 PM   Modules accepted: Orders

## 2022-07-28 ENCOUNTER — Telehealth: Payer: Self-pay | Admitting: Medical

## 2022-07-28 NOTE — Telephone Encounter (Signed)
Spoke with Reagan at [facility] , made aware of the regular dosage

## 2022-07-28 NOTE — Telephone Encounter (Signed)
[facility] called stating that they wanted to look into the regular biaxin vs the XL as they do not have the XL in stock.

## 2022-09-07 ENCOUNTER — Other Ambulatory Visit: Payer: Self-pay | Admitting: Medical

## 2022-12-30 ENCOUNTER — Telehealth: Payer: Self-pay

## 2022-12-30 NOTE — Patient Outreach (Signed)
  Care Coordination   12/30/2022 Name: Mark Duncan MRN: [id number] DOB: December 01, 1971   Care Coordination Outreach Attempts:  An unsuccessful telephone outreach was attempted today to offer the patient information about available care coordination services as a benefit of their health plan.   Follow Up Plan:  Additional outreach attempts will be made to offer the patient care coordination information and services.   Encounter Outcome:  No Answer   Care Coordination Interventions:  No, not indicated    Thea Silversmith, RN, MSN, BSN, [place] Coordinator [phone]

## 2023-01-22 ENCOUNTER — Telehealth: Payer: Self-pay

## 2023-01-22 NOTE — Patient Outreach (Signed)
  Care Coordination   01/22/2023 Name: Mark Duncan MRN: [id number] DOB: July 18, 1971   Care Coordination Outreach Attempts:  A second unsuccessful outreach was attempted today to offer the patient with information about available care coordination services as a benefit of their health plan.     Follow Up Plan:  Additional outreach attempts will be made to offer the patient care coordination information and services.   Encounter Outcome:  No Answer   Care Coordination Interventions:  No, not indicated    Thea Silversmith, RN, MSN, BSN, [place] Coordinator [phone]

## 2023-03-23 ENCOUNTER — Encounter: Payer: Self-pay | Admitting: *Deleted

## 2023-05-21 ENCOUNTER — Encounter: Payer: Self-pay | Admitting: Gastroenterology

## 2023-07-08 ENCOUNTER — Ambulatory Visit: Payer: Federal, State, Local not specified - PPO | Admitting: Gastroenterology

## 2023-07-08 NOTE — Progress Notes (Deleted)
Chief Complaint: Primary GI MD:  HPI: 52 year old male history of GERD presents for evaluation of  Seen by PCP 07/2022 for GERD and epigastric pain.  Workup at that time showed blood test positive for H. pylori IgG.  He then had a H. pylori breath test that was positive and PCP prescribed Augmentin and Biaxin for 10 days  US abdomen complete 07/18/2022 was negative.  No gallstones or gallbladder wall thickening.  Normal liver.      PREVIOUS GI WORKUP     Past Medical History:  Diagnosis Date   Blood transfusion without reported diagnosis    GERD (gastroesophageal reflux disease)    Hyperlipidemia     Past Surgical History:  Procedure Laterality Date   HAND SURGERY     Glass removed   KNEE ARTHROSCOPY W/ MENISCAL REPAIR      Current Outpatient Medications  Medication Sig Dispense Refill   clarithromycin (BIAXIN XL) 500 MG 24 hr tablet Take 2 tablets (1,000 mg total) by mouth daily. 20 tablet 0   famotidine (PEPCID) 20 MG tablet TAKE 1 TABLET BY MOUTH TWICE A DAY 60 tablet 0   loratadine-pseudoephedrine (CLARITIN-D 24-HOUR) 10-240 MG 24 hr tablet Take 1 tablet by mouth daily.     omeprazole (PRILOSEC) 20 MG capsule Take 1 capsule (20 mg total) by mouth daily. 30 capsule 1   No current facility-administered medications for this visit.    Allergies as of 07/08/2023 - Review Complete 07/23/2022  Allergen Reaction Noted   Fish allergy Hives, Itching, and Swelling 02/23/2017    Family History  Problem Relation Age of Onset   Heart disease Maternal Uncle    Heart disease Maternal Grandmother     Social History   Socioeconomic History   Marital status: Married    Spouse name: Not on file   Number of children: Not on file   Years of education: Not on file   Highest education level: Not on file  Occupational History   Not on file  Tobacco Use   Smoking status: Light Smoker    Types: Cigarettes   Smokeless tobacco: Former  Building services engineer status: Never  Used  Substance and Sexual Activity   Alcohol use: No    Alcohol/week: 0.0 standard drinks of alcohol   Drug use: Yes    Types: Marijuana   Sexual activity: Not on file  Other Topics Concern   Not on file  Social History Narrative   Not on file   Social Determinants of Health   Financial Resource Strain: Not on file  Food Insecurity: Not on file  Transportation Needs: Not on file  Physical Activity: Not on file  Stress: Not on file  Social Connections: Not on file  Intimate Partner Violence: Not on file    Review of Systems:    Constitutional: No weight loss, fever, chills, weakness or fatigue HEENT: Eyes: No change in vision               Ears, Nose, Throat:  No change in hearing or congestion Skin: No rash or itching Cardiovascular: No chest pain, chest pressure or palpitations   Respiratory: No SOB or cough Gastrointestinal: See HPI and otherwise negative Genitourinary: No dysuria or change in urinary frequency Neurological: No headache, dizziness or syncope Musculoskeletal: No new muscle or joint pain Hematologic: No bleeding or bruising Psychiatric: No history of depression or anxiety    Physical Exam:  Vital signs: There were no vitals taken for this visit.  Constitutional: NAD, Well developed, Well nourished, alert and cooperative Head:  Normocephalic and atraumatic. Eyes:   PEERL, EOMI. No icterus. Conjunctiva pink. Respiratory: Respirations even and unlabored. Lungs clear to auscultation bilaterally.   No wheezes, crackles, or rhonchi.  Cardiovascular:  Regular rate and rhythm. No peripheral edema, cyanosis or pallor.  Gastrointestinal:  Soft, nondistended, nontender. No rebound or guarding. Normal bowel sounds. No appreciable masses or hepatomegaly. Rectal:  Not performed.  Msk:  Symmetrical without gross deformities. Without edema, no deformity or joint abnormality.  Neurologic:  Alert and  oriented x4;  grossly normal neurologically.  Skin:   Dry and  intact without significant lesions or rashes. Psychiatric: Oriented to person, place and time. Demonstrates good judgement and reason without abnormal affect or behaviors.   RELEVANT LABS AND IMAGING: CBC    Component Value Date/Time   WBC 10.2 07/17/2022 1157   RBC 5.20 07/17/2022 1157   HGB 16.7 07/17/2022 1157   HCT 50.2 07/17/2022 1157   PLT 205.0 07/17/2022 1157   MCV 96.5 07/17/2022 1157   MCH 31.5 05/12/2020 0450   MCHC 33.3 07/17/2022 1157   RDW 13.1 07/17/2022 1157   LYMPHSABS 2.9 07/17/2022 1157   MONOABS 0.9 07/17/2022 1157   EOSABS 0.2 07/17/2022 1157   BASOSABS 0.2 (H) 07/17/2022 1157    CMP     Component Value Date/Time   NA 137 07/17/2022 1157   K 4.4 07/17/2022 1157   CL 101 07/17/2022 1157   CO2 30 07/17/2022 1157   GLUCOSE 77 07/17/2022 1157   BUN 12 07/17/2022 1157   CREATININE 1.08 07/17/2022 1157   CREATININE 0.92 01/02/2017 1653   CALCIUM 10.0 07/17/2022 1157   PROT 7.4 07/17/2022 1157   ALBUMIN 4.5 07/17/2022 1157   AST 15 07/17/2022 1157   ALT 12 07/17/2022 1157   ALKPHOS 65 07/17/2022 1157   BILITOT 0.4 07/17/2022 1157   GFRNONAA >60 12/25/2020 0845   GFRAA >60 05/12/2020 0450     Assessment/Plan:       Lara Mulch [facility] 07/08/2023, 1:09 PM  Cc: [name], PA-C

## 2024-02-25 NOTE — Progress Notes (Deleted)
 New Patient Note  RE: Mark Duncan MRN: [id number] DOB: [birth year] Date of Office Visit: 02/26/2024  Consult requested by: Marisue Brooklyn Primary care provider: [name], PA-C  Chief Complaint: No chief complaint on file.  History of Present Illness: I had the pleasure of seeing Mark Duncan for initial evaluation at the [facility] on 02/25/2024. He is a 53 y.o. male, who is referred here by [name], PA-C for the evaluation of allergic rhinitis.  Discussed the use of AI scribe software for clinical note transcription with the patient, who gave verbal consent to proceed.  History of Present Illness             He reports symptoms of ***. Symptoms have been going on for *** years. The symptoms are present *** all year around with worsening in ***. Other triggers include exposure to ***. Anosmia: ***. Headache: ***. He has used *** with ***fair improvement in symptoms. Sinus infections: ***. Previous work up includes: ***. Previous ENT evaluation: ***. Previous sinus imaging: ***. History of nasal polyps: ***. Last eye exam: ***. History of reflux: ***.  Assessment and Plan: [name] is a 53 y.o. male with: ***  Assessment and Plan               No follow-ups on file.  No orders of the defined types were placed in this encounter.  Lab Orders  No laboratory test(s) ordered today    Other allergy screening: Asthma: {Blank single:19197::"yes","no"} Rhino conjunctivitis: {Blank single:19197::"yes","no"} Food allergy: {Blank single:19197::"yes","no"} Medication allergy: {Blank single:19197::"yes","no"} Hymenoptera allergy: {Blank single:19197::"yes","no"} Urticaria: {Blank single:19197::"yes","no"} Eczema:{Blank single:19197::"yes","no"} History of recurrent infections suggestive of immunodeficency: {Blank single:19197::"yes","no"}  Diagnostics: Spirometry:  Tracings reviewed. His effort: {Blank  single:19197::"Good reproducible efforts.","It was hard to get consistent efforts and there is a question as to whether this reflects a maximal maneuver.","Poor effort, data can not be interpreted."} FVC: ***L FEV1: ***L, ***% predicted FEV1/FVC ratio: ***% Interpretation: {Blank single:19197::"Spirometry consistent with mild obstructive disease","Spirometry consistent with moderate obstructive disease","Spirometry consistent with severe obstructive disease","Spirometry consistent with possible restrictive disease","Spirometry consistent with mixed obstructive and restrictive disease","Spirometry uninterpretable due to technique","Spirometry consistent with normal pattern","No overt abnormalities noted given today's efforts"}.  Please see scanned spirometry results for details.  Skin Testing: {Blank single:19197::"Select foods","Environmental allergy panel","Environmental allergy panel and select foods","Food allergy panel","None","Deferred due to recent antihistamines use"}. *** Results discussed with patient/family.   Past Medical History: Patient Active Problem List   Diagnosis Date Noted  . Coronary artery calcification of native artery 04/25/2019  . Mixed hyperlipidemia 04/25/2019  . Atypical chest pain 05/24/2015  . Rib pain on left side 05/24/2015  . GERD (gastroesophageal reflux disease) 05/24/2015  . History of bloody stools 05/24/2015  . Smoker 05/24/2015   Past Medical History:  Diagnosis Date  . Blood transfusion without reported diagnosis   . GERD (gastroesophageal reflux disease)   . Hyperlipidemia    Past Surgical History: Past Surgical History:  Procedure Laterality Date  . HAND SURGERY     Glass removed  . KNEE ARTHROSCOPY W/ MENISCAL REPAIR     Medication List:  Current Outpatient Medications  Medication Sig Dispense Refill  . clarithromycin (BIAXIN XL) 500 MG 24 hr tablet Take 2 tablets (1,000 mg total) by mouth daily. 20 tablet 0  . famotidine (PEPCID) 20  MG tablet TAKE 1 TABLET BY MOUTH TWICE A DAY 60 tablet 0  . loratadine-pseudoephedrine (CLARITIN-D 24-HOUR) 10-240 MG 24 hr tablet Take 1 tablet by  mouth daily.    Marland Kitchen omeprazole (PRILOSEC) 20 MG capsule Take 1 capsule (20 mg total) by mouth daily. 30 capsule 1   No current facility-administered medications for this visit.   Allergies: Allergies  Allergen Reactions  . Fish Allergy Hives, Itching and Swelling    Grouper Only   Social History: Social History   Socioeconomic History  . Marital status: Married    Spouse name: Not on file  . Number of children: Not on file  . Years of education: Not on file  . Highest education level: Not on file  Occupational History  . Not on file  Tobacco Use  . Smoking status: Light Smoker    Types: Cigarettes  . Smokeless tobacco: Former  Advertising account planner  . Vaping status: Never Used  Substance and Sexual Activity  . Alcohol use: No    Alcohol/week: 0.0 standard drinks of alcohol  . Drug use: Yes    Types: Marijuana  . Sexual activity: Not on file  Other Topics Concern  . Not on file  Social History Narrative  . Not on file   Social Drivers of Health   Financial Resource Strain: Not on file  Food Insecurity: Not on file  Transportation Needs: Not on file  Physical Activity: Not on file  Stress: Not on file  Social Connections: Not on file   Lives in a ***. Smoking: *** Occupation: ***  Environmental HistorySurveyor, minerals in the house: Copywriter, advertising in the family room: {Blank single:19197::"yes","no"} Carpet in the bedroom: {Blank single:19197::"yes","no"} Heating: {Blank single:19197::"electric","gas","heat pump"} Cooling: {Blank single:19197::"central","window","heat pump"} Pet: {Blank single:19197::"yes ***","no"}  Family History: Family History  Problem Relation Age of Onset  . Heart disease Maternal Uncle   . Heart disease Maternal Grandmother    Problem                                Relation Asthma                                   *** Eczema                                *** Food allergy                          *** Allergic rhino conjunctivitis     ***  Review of Systems  Constitutional:  Negative for appetite change, chills, fever and unexpected weight change.  HENT:  Negative for congestion and rhinorrhea.   Eyes:  Negative for itching.  Respiratory:  Negative for cough, chest tightness, shortness of breath and wheezing.   Cardiovascular:  Negative for chest pain.  Gastrointestinal:  Negative for abdominal pain.  Genitourinary:  Negative for difficulty urinating.  Skin:  Negative for rash.  Neurological:  Negative for headaches.   Objective: There were no vitals taken for this visit. There is no height or weight on file to calculate BMI. Physical Exam Vitals and nursing note reviewed.  Constitutional:      Appearance: Normal appearance. He is well-developed.  HENT:     Head: Normocephalic and atraumatic.     Right Ear: Tympanic membrane and external ear normal.     Left Ear: Tympanic membrane and external ear normal.  Nose: Nose normal.     Mouth/Throat:     Mouth: Mucous membranes are moist.     Pharynx: Oropharynx is clear.  Eyes:     Conjunctiva/sclera: Conjunctivae normal.  Cardiovascular:     Rate and Rhythm: Normal rate and regular rhythm.     Heart sounds: Normal heart sounds. No murmur heard.    No friction rub. No gallop.  Pulmonary:     Effort: Pulmonary effort is normal.     Breath sounds: Normal breath sounds. No wheezing, rhonchi or rales.  Musculoskeletal:     Cervical back: Neck supple.  Skin:    General: Skin is warm.     Findings: No rash.  Neurological:     Mental Status: He is alert and oriented to person, place, and time.  Psychiatric:        Behavior: Behavior normal.  The plan was reviewed with the patient/family, and all questions/concerned were addressed.  It was my pleasure to see Bain today and participate  in his care. Please feel free to contact me with any questions or concerns.  Sincerely,  Wyline Mood, DO Allergy & Immunology  [facility] office: [phone] [facility] office: [phone]

## 2024-02-26 ENCOUNTER — Ambulatory Visit: Payer: Self-pay | Admitting: Allergy

## 2024-04-01 ENCOUNTER — Other Ambulatory Visit: Payer: Self-pay

## 2024-04-01 ENCOUNTER — Ambulatory Visit
Admission: RE | Admit: 2024-04-01 | Discharge: 2024-04-01 | Disposition: A | Discharge status: Home / Self Care | Source: Ambulatory Visit | Attending: Emergency Medicine | Admitting: Emergency Medicine

## 2024-04-01 VITALS — BP 108/71 | HR 73 | Temp 98.3°F | Resp 16

## 2024-04-01 DIAGNOSIS — J019 Acute sinusitis, unspecified: Secondary | ICD-10-CM

## 2024-04-01 DIAGNOSIS — B9689 Other specified bacterial agents as the cause of diseases classified elsewhere: Secondary | ICD-10-CM | POA: Diagnosis not present

## 2024-04-01 MED ORDER — AMOXICILLIN-POT CLAVULANATE 875-125 MG PO TABS: 1.0000 | ORAL_TABLET | Freq: Two times a day (BID) | ORAL | 0 refills | Status: DC

## 2024-04-01 NOTE — ED Triage Notes (Signed)
 PT reports facial pain and sinus pain . Pt feels like he has a sinus infection.

## 2024-04-01 NOTE — Discharge Instructions (Addendum)
 Take the Augmentin  twice daily with food for the next 7 days.  You can consider Mucinex 1200 mg daily to help loosen secretions in addition to at least 64 ounces of water daily and sleeping with a humidifier.  Please follow-up with an ENT specialist and allergy specialist as discussed for further investigation and evaluation of your reoccurring symptoms.  Return to clinic for any new or urgent symptoms.

## 2024-04-01 NOTE — ED Provider Notes (Signed)
 [facility]    CSN: [id number] Arrival date & time: 04/01/24  1740      History   Chief Complaint Chief Complaint  Patient presents with   Nasal Congestion    Entered by patient   Facial Pain    HPI Mark Duncan is a 53 y.o. male.   Patient presents to clinic over severe left-sided facial pressure and pain that has been ongoing for the past 6 days.  Does have a history of a deviated septum.  Reports severe seasonal allergies and is heavily considered allergy testing/injections due to not responding well to oral antihistamines, but these are expensive so he has not yet started.  He has tried a saline nasal rinse, feels like the saline solution gets stuck and has a lot of pain and pressure with this.  Is unable to sleep on his left side due to pain and pressure.  Felt hot and cold the past few days, did not measure his temperature.  Afebrile in clinic.  Has had a mild cough, endorses postnasal drip.  Feels like he can feel crepitus and crackling when he presses on his left maxillary and frontal sinuses.  The history is provided by the patient and medical records.    Past Medical History:  Diagnosis Date   Blood transfusion without reported diagnosis    GERD (gastroesophageal reflux disease)    Hyperlipidemia     Patient Active Problem List   Diagnosis Date Noted   Coronary artery calcification of native artery 04/25/2019   Mixed hyperlipidemia 04/25/2019   Atypical chest pain 05/24/2015   Rib pain on left side 05/24/2015   GERD (gastroesophageal reflux disease) 05/24/2015   History of bloody stools 05/24/2015   Smoker 05/24/2015    Past Surgical History:  Procedure Laterality Date   HAND SURGERY     Glass removed   KNEE ARTHROSCOPY W/ MENISCAL REPAIR         Home Medications    Prior to Admission medications   Medication Sig Start Date End Date Taking? Authorizing Provider  amoxicillin -clavulanate (AUGMENTIN ) 875-125 MG tablet Take 1 tablet  by mouth every 12 (twelve) hours. 04/01/24   Harlow Lighter, Kymere Fullington  N, FNP    Family History Family History  Problem Relation Age of Onset   Heart disease Maternal Uncle    Heart disease Maternal Grandmother     Social History Social History   Tobacco Use   Smoking status: Light Smoker    Types: Cigarettes   Smokeless tobacco: Former  Building services engineer status: Never Used  Substance Use Topics   Alcohol use: No    Alcohol/week: 0.0 standard drinks of alcohol   Drug use: Yes    Types: Marijuana     Allergies   Fish allergy   Review of Systems Review of Systems  Per HPI  Physical Exam Triage Vital Signs ED Triage Vitals  Encounter Vitals Group     BP 04/01/24 1754 108/71     Systolic BP Percentile --      Diastolic BP Percentile --      Pulse Rate 04/01/24 1754 73     Resp 04/01/24 1754 16     Temp 04/01/24 1754 98.3 F (36.8 C)     Temp Source 04/01/24 1754 Oral     SpO2 04/01/24 1754 95 %     Weight --      Height --      Head Circumference --      Peak  Flow --      Pain Score 04/01/24 1756 7     Pain Loc --      Pain Education --      Exclude from Growth Chart --    No data found.  Updated Vital Signs BP 108/71 (BP Location: Right Arm)   Pulse 73   Temp 98.3 F (36.8 C) (Oral)   Resp 16   SpO2 95%   Visual Acuity Right Eye Distance:   Left Eye Distance:   Bilateral Distance:    Right Eye Near:   Left Eye Near:    Bilateral Near:     Physical Exam Vitals and nursing note reviewed.  Constitutional:      Appearance: Normal appearance.  HENT:     Head: Normocephalic and atraumatic.     Right Ear: External ear normal.     Left Ear: External ear normal.     Nose: Congestion present.     Mouth/Throat:     Mouth: Mucous membranes are moist.     Pharynx: Posterior oropharyngeal erythema present.  Eyes:     Conjunctiva/sclera: Conjunctivae normal.  Cardiovascular:     Rate and Rhythm: Normal rate and regular rhythm.     Heart sounds:  Normal heart sounds. No murmur heard. Pulmonary:     Effort: Pulmonary effort is normal. No respiratory distress.     Breath sounds: Normal breath sounds. No wheezing.  Musculoskeletal:        General: Normal range of motion.  Skin:    General: Skin is warm and dry.  Neurological:     General: No focal deficit present.     Mental Status: He is alert.  Psychiatric:        Mood and Affect: Mood normal.      UC Treatments / Results  Labs (all labs ordered are listed, but only abnormal results are displayed) Labs Reviewed - No data to display  EKG   Radiology No results found.  Procedures Procedures (including critical care time)  Medications Ordered in UC Medications - No data to display  Initial Impression / Assessment and Plan / UC Course  I have reviewed the triage vital signs and the nursing notes.  Pertinent labs & imaging results that were available during my care of the patient were reviewed by me and considered in my medical decision making (see chart for details).   Vitals in triage reviewed, patient is hemodynamically stable.  Congestion, postnasal drip and posterior pharynx erythema present on physical exam as well as left-sided frontal maxillary sinus tenderness to palpation.  Lungs are vesicular, regular rate and rhythm.  Symptoms consistent with acute bacterial sinusitis, will treat with Augmentin .  Encouraged ENT/allergy follow-up.  Plan of care, follow-up care return precautions given, no questions at this time.  Final Clinical Impressions(s) / UC Diagnoses   Final diagnoses:  Acute bacterial sinusitis     Discharge Instructions      Take the Augmentin  twice daily with food for the next 7 days.  You can consider Mucinex 1200 mg daily to help loosen secretions in addition to at least 64 ounces of water daily and sleeping with a humidifier.  Please follow-up with an ENT specialist and allergy specialist as discussed for further investigation and  evaluation of your reoccurring symptoms.  Return to clinic for any new or urgent symptoms.    ED Prescriptions     Medication Sig Dispense Auth. Provider   amoxicillin -clavulanate (AUGMENTIN ) 875-125 MG tablet  (Status: Discontinued)  Take 1 tablet by mouth every 12 (twelve) hours. 14 tablet Harlow Lighter, Adelin Ventrella  N, FNP   amoxicillin -clavulanate (AUGMENTIN ) 875-125 MG tablet Take 1 tablet by mouth every 12 (twelve) hours. 14 tablet Harlow Lighter, Briya Lookabaugh  N, FNP      PDMP not reviewed this encounter.   Araceli Knight, [place] 04/01/24 Jerre Moots

## 2024-04-26 ENCOUNTER — Encounter: Payer: Self-pay | Admitting: Family

## 2024-04-26 ENCOUNTER — Other Ambulatory Visit: Payer: Self-pay

## 2024-04-26 ENCOUNTER — Emergency Department (HOSPITAL_BASED_OUTPATIENT_CLINIC_OR_DEPARTMENT_OTHER)

## 2024-04-26 ENCOUNTER — Ambulatory Visit: Admitting: Family

## 2024-04-26 ENCOUNTER — Emergency Department (HOSPITAL_BASED_OUTPATIENT_CLINIC_OR_DEPARTMENT_OTHER): Admission: EM | Admit: 2024-04-26 | Discharge: 2024-04-26 | Disposition: A | Discharge status: Home / Self Care

## 2024-04-26 ENCOUNTER — Encounter (HOSPITAL_BASED_OUTPATIENT_CLINIC_OR_DEPARTMENT_OTHER): Payer: Self-pay

## 2024-04-26 VITALS — BP 142/78 | HR 64 | Temp 97.9°F | Ht 73.0 in | Wt 184.2 lb

## 2024-04-26 DIAGNOSIS — R051 Acute cough: Secondary | ICD-10-CM

## 2024-04-26 DIAGNOSIS — R1111 Vomiting without nausea: Secondary | ICD-10-CM | POA: Diagnosis not present

## 2024-04-26 DIAGNOSIS — E860 Dehydration: Secondary | ICD-10-CM | POA: Insufficient documentation

## 2024-04-26 DIAGNOSIS — K529 Noninfective gastroenteritis and colitis, unspecified: Secondary | ICD-10-CM | POA: Diagnosis not present

## 2024-04-26 DIAGNOSIS — R112 Nausea with vomiting, unspecified: Secondary | ICD-10-CM | POA: Diagnosis not present

## 2024-04-26 DIAGNOSIS — D72829 Elevated white blood cell count, unspecified: Secondary | ICD-10-CM | POA: Diagnosis not present

## 2024-04-26 DIAGNOSIS — K4020 Bilateral inguinal hernia, without obstruction or gangrene, not specified as recurrent: Secondary | ICD-10-CM | POA: Diagnosis not present

## 2024-04-26 LAB — URINALYSIS, ROUTINE W REFLEX MICROSCOPIC
Glucose, UA: NEGATIVE mg/dL
Ketones, ur: >=80 mg/dL — AB
Leukocytes,Ua: NEGATIVE
Nitrite: NEGATIVE
Protein, ur: 100 mg/dL — AB
Specific Gravity, Urine: 1.02 (ref 1.005–1.030)
pH: 7 (ref 5.0–8.0)

## 2024-04-26 LAB — URINALYSIS, MICROSCOPIC (REFLEX)

## 2024-04-26 LAB — COMPREHENSIVE METABOLIC PANEL WITH GFR
ALT: 18 U/L (ref 0–44)
AST: 20 U/L (ref 15–41)
Albumin: 4.7 g/dL (ref 3.5–5.0)
Alkaline Phosphatase: 82 U/L (ref 38–126)
Anion gap: 18 — ABNORMAL HIGH (ref 5–15)
BUN: 12 mg/dL (ref 6–20)
CO2: 21 mmol/L — ABNORMAL LOW (ref 22–32)
Calcium: 9.9 mg/dL (ref 8.9–10.3)
Chloride: 97 mmol/L — ABNORMAL LOW (ref 98–111)
Creatinine, Ser: 1.04 mg/dL (ref 0.61–1.24)
GFR, Estimated: >60 mL/min (ref >60)
Glucose, Bld: 127 mg/dL — ABNORMAL HIGH (ref 70–99)
Potassium: 3.5 mmol/L (ref 3.5–5.1)
Sodium: 136 mmol/L (ref 135–145)
Total Bilirubin: 0.8 mg/dL (ref 0.0–1.2)
Total Protein: 7.6 g/dL (ref 6.5–8.1)

## 2024-04-26 LAB — CBC
HCT: 52.2 % — ABNORMAL HIGH (ref 39.0–52.0)
Hemoglobin: 18.6 g/dL — ABNORMAL HIGH (ref 13.0–17.0)
MCH: 32 pg (ref 26.0–34.0)
MCHC: 35.6 g/dL (ref 30.0–36.0)
MCV: 89.8 fL (ref 80.0–100.0)
Platelets: 238 10*3/uL (ref 150–400)
RBC: 5.81 MIL/uL (ref 4.22–5.81)
RDW: 12.2 % (ref 11.5–15.5)
WBC: 13.6 10*3/uL — ABNORMAL HIGH (ref 4.0–10.5)
nRBC: 0 % (ref 0.0–0.2)

## 2024-04-26 LAB — TROPONIN T, HIGH SENSITIVITY
Troponin T High Sensitivity: <15 ng/L (ref <19)
Troponin T High Sensitivity: <15 ng/L (ref <19)

## 2024-04-26 LAB — POC INFLUENZA A&B (BINAX/QUICKVUE)
Influenza A, POC: NEGATIVE
Influenza B, POC: NEGATIVE

## 2024-04-26 LAB — POC COVID19 BINAXNOW: SARS Coronavirus 2 Ag: NEGATIVE

## 2024-04-26 LAB — LIPASE, BLOOD: Lipase: 46 U/L (ref 11–51)

## 2024-04-26 MED ORDER — AZITHROMYCIN 500 MG PO TABS: 500.0000 mg | ORAL_TABLET | Freq: Every day | ORAL | 0 refills | Status: AC

## 2024-04-26 MED ORDER — SODIUM CHLORIDE 0.9 % IV BOLUS
1000.0000 mL | Freq: Once | INTRAVENOUS | Status: AC
  Administered 2024-04-26: 1000 mL via INTRAVENOUS

## 2024-04-26 MED ORDER — ONDANSETRON HCL 4 MG/2ML IJ SOLN
4.0000 mg | Freq: Once | INTRAMUSCULAR | Status: AC | PRN
Start: 2024-04-26 — End: 2024-04-26
  Administered 2024-04-26: 4 mg via INTRAVENOUS
  Filled 2024-04-26: qty 2

## 2024-04-26 MED ORDER — ONDANSETRON HCL 4 MG/2ML IJ SOLN
4.0000 mg | Freq: Once | INTRAMUSCULAR | Status: AC
  Administered 2024-04-26: 4 mg via INTRAVENOUS
  Filled 2024-04-26: qty 2

## 2024-04-26 MED ORDER — IOHEXOL 300 MG/ML  SOLN
100.0000 mL | Freq: Once | INTRAMUSCULAR | Status: AC | PRN
  Administered 2024-04-26: 100 mL via INTRAVENOUS

## 2024-04-26 MED ORDER — MORPHINE SULFATE (PF) 4 MG/ML IV SOLN
4.0000 mg | Freq: Once | INTRAVENOUS | Status: AC
  Administered 2024-04-26: 4 mg via INTRAVENOUS
  Filled 2024-04-26: qty 1

## 2024-04-26 MED ORDER — AZITHROMYCIN 250 MG PO TABS
500.0000 mg | ORAL_TABLET | Freq: Once | ORAL | Status: AC
  Administered 2024-04-26: 500 mg via ORAL
  Filled 2024-04-26: qty 2

## 2024-04-26 NOTE — ED Notes (Signed)
 Pt denies chest pain at the time. Denies pain. States he's only nauseas.

## 2024-04-26 NOTE — Patient Instructions (Signed)
 I am so sorry you are not feeling well.   You are also overdue to see your PCP. You have not seen Gaylin Ke since 2023; please schedule a CPE.

## 2024-04-26 NOTE — ED Notes (Signed)
 Pt informed EDT that he is now having chest pain, provider notified.

## 2024-04-26 NOTE — ED Provider Notes (Signed)
 [facility] Provider Note   CSN: [id number] Arrival date & time: 04/26/24  1347     History  Chief Complaint  Patient presents with   Emesis    Mark Duncan is a 53 y.o. male.  53 year old male with history of GERD and HLD presents with wife for vomiting and diarrhea onset Sunday night/Monday morning. Reports numerous episodes of vomiting and diarrhea, has felt feverish at home, generalized abdominal pain. Reports blood in stool, although chronic, has not seen GI for this, unable to describe appearance, not on thinners. No sick contacts, no travel. Wife notes they ate shrimp and salad on Sunday and patient noted the salad did not taste right, wife ate the salad and is not ill.  After initial assessment, patient noted chest pain, EKG obtained and add on Trop.        Home Medications Prior to Admission medications   Medication Sig Start Date End Date Taking? Authorizing Provider  azithromycin  (ZITHROMAX ) 500 MG tablet Take 1 tablet (500 mg total) by mouth daily for 4 days. Take first 2 tablets together, then 1 every day until finished. 04/26/24 04/30/24 Yes Darlis Eisenmenger, PA-C  amoxicillin -clavulanate (AUGMENTIN ) 875-125 MG tablet Take 1 tablet by mouth every 12 (twelve) hours. Patient not taking: Reported on 04/26/2024 04/01/24   Harlow Lighter, [place]  N, FNP      Allergies    Fish allergy    Review of Systems   Review of Systems Negative except per HPI Physical Exam Updated Vital Signs BP 125/79   Pulse 65   Temp 97.6 F (36.4 C)   Resp 18   SpO2 100%  Physical Exam Vitals and nursing note reviewed.  Constitutional:      General: He is not in acute distress.    Appearance: He is well-developed. He is not diaphoretic.     Comments: Sitting on the chair actively spitting into wastebasket, appears uncomfortable  HENT:     Head: Normocephalic and atraumatic.  Cardiovascular:     Rate and Rhythm: Normal rate and regular  rhythm.     Heart sounds: Normal heart sounds.  Pulmonary:     Effort: Pulmonary effort is normal.     Breath sounds: Normal breath sounds.  Abdominal:     Palpations: Abdomen is soft.     Tenderness: There is generalized abdominal tenderness.  Skin:    General: Skin is warm and dry.  Neurological:     Mental Status: He is alert and oriented to person, place, and time.  Psychiatric:        Behavior: Behavior normal.     ED Results / Procedures / Treatments   Labs (all labs ordered are listed, but only abnormal results are displayed) Labs Reviewed  COMPREHENSIVE METABOLIC PANEL WITH GFR - Abnormal; Notable for the following components:      Result Value   Chloride 97 (*)    CO2 21 (*)    Glucose, Bld 127 (*)    Anion gap 18 (*)    All other components within normal limits  CBC - Abnormal; Notable for the following components:   WBC 13.6 (*)    Hemoglobin 18.6 (*)    HCT 52.2 (*)    All other components within normal limits  URINALYSIS, ROUTINE W REFLEX MICROSCOPIC - Abnormal; Notable for the following components:   Hgb urine dipstick MODERATE (*)    Bilirubin Urine SMALL (*)    Ketones, ur >=80 (*)  Protein, ur 100 (*)    All other components within normal limits  URINALYSIS, MICROSCOPIC (REFLEX) - Abnormal; Notable for the following components:   Bacteria, UA RARE (*)    All other components within normal limits  LIPASE, BLOOD  POC OCCULT BLOOD, ED  TROPONIN T, HIGH SENSITIVITY  TROPONIN T, HIGH SENSITIVITY    EKG EKG Interpretation Date/Time:  Tuesday Apr 26 2024 14:31:34 EDT Ventricular Rate:  58 PR Interval:  133 QRS Duration:  94 QT Interval:  410 QTC Calculation: 403 R Axis:   83  Text Interpretation: Sinus rhythm Confirmed by Abner Hoffman [phone]) on 04/26/2024 2:32:25 PM  Radiology CT ABDOMEN PELVIS W CONTRAST Result Date: 04/26/2024 CLINICAL DATA:  Two day history of emesis, weakness, and lethargy EXAM: CT ABDOMEN AND PELVIS WITH CONTRAST  TECHNIQUE: Multidetector CT imaging of the abdomen and pelvis was performed using the standard protocol following bolus administration of intravenous contrast. RADIATION DOSE REDUCTION: This exam was performed according to the departmental dose-optimization program which includes automated exposure control, adjustment of the mA and/or kV according to patient size and/or use of iterative reconstruction technique. CONTRAST:  OMNIPAQUE IOHEXOL 300 MG/ML  SOLN COMPARISON:  CT pelvis dated 05/12/2020, MRI pelvis dated 05/12/2020 FINDINGS: Lower chest: No focal consolidation or pulmonary nodule in the lung bases. No pleural effusion or pneumothorax demonstrated. Partially imaged heart size is normal. Hepatobiliary: No focal hepatic lesions. No intra or extrahepatic biliary ductal dilation. Normal gallbladder. Pancreas: No focal lesions or main ductal dilation. Spleen: Normal in size without focal abnormality. Adrenals/Urinary Tract: No adrenal nodules. No suspicious renal masses or hydronephrosis. Punctate nonobstructing left interpolar stones. Decompressed urinary bladder. Stomach/Bowel: Normal appearance of the stomach. Segmental mild dilation of small-bowel loops within the abdomen without abrupt caliber transition. Normal appendix. Vascular/Lymphatic: Aortic atherosclerosis. No enlarged abdominal or pelvic lymph nodes. Reproductive: Prostate is unremarkable. Other: No free fluid, fluid collection, or free air. Musculoskeletal: No acute or abnormal lytic or blastic osseous lesions. Small fat-containing bilateral inguinal hernias. IMPRESSION: 1. Segmental mild dilation of small-bowel loops within the abdomen without abrupt caliber transition, which may represent infectious/inflammatory enteritis. 2. Punctate nonobstructing left interpolar renal stone. 3.  Aortic Atherosclerosis (ICD10-I70.0). Electronically Signed   By: Limin  Xu M.D.   On: 04/26/2024 17:50    Procedures Procedures    Medications Ordered in  ED Medications  azithromycin  (ZITHROMAX ) tablet 500 mg (has no administration in time range)  ondansetron  (ZOFRAN ) injection 4 mg (4 mg Intravenous Given 04/26/24 1440)  sodium chloride 0.9 % bolus 1,000 mL ( Intravenous Stopped 04/26/24 1544)  ondansetron  (ZOFRAN ) injection 4 mg (4 mg Intravenous Given 04/26/24 1546)  morphine  (PF) 4 MG/ML injection 4 mg (4 mg Intravenous Given 04/26/24 1547)  iohexol (OMNIPAQUE) 300 MG/ML solution 100 mL (100 mLs Intravenous Contrast Given 04/26/24 1658)  sodium chloride 0.9 % bolus 1,000 mL (1,000 mLs Intravenous New Bag/Given 04/26/24 1728)    ED Course/ Medical Decision Making/ A&P                                 Medical Decision Making Amount and/or Complexity of Data Reviewed Labs: ordered. Radiology: ordered.  Risk Prescription drug management.   This patient presents to the ED for concern of vomiting/diarrhea, abdominal pain, this involves an extensive number of treatment options, and is a complaint that carries with it a high risk of complications and morbidity.  The differential diagnosis includes but not  limited to colitis, gastroenteritis, SBO, appendicitis, metabolic/electrolyte derangement    Co morbidities that complicate the patient evaluation  GERD, HLD   Additional history obtained:  Additional history obtained from wife at bedside who contributes to history as above External records from outside source obtained and reviewed including prior labs on file   Lab Tests:  I Ordered, and personally interpreted labs.  The pertinent results include:  CBC with non specific leukocytosis at 13.6. troponin <15, 2nd trop <15, lipase WNL. CMP with mild metabolic acidosis.    Imaging Studies ordered:  I ordered imaging studies including CT abd/pelvis  I independently visualized and interpreted imaging which showed enteritis  I agree with the radiologist interpretation   Cardiac Monitoring: / EKG:  The patient was maintained on a  cardiac monitor.  I personally viewed and interpreted the cardiac monitored which showed an underlying rhythm of: sinus rhythm, rate 58   Problem List / ED Course / Critical interventions / Medication management  53 year old presents with complaint of abdominal pain with nausea, vomiting, diarrhea onset Sunday evening.  Vomiting on arrival.  Patient was provided with Zofran, morphine, IV fluids with significant improvement in his symptoms.  He was found to have a slight leukocytosis white count of 13.6 with mild metabolic acidosis.  His urinalysis is positive for hemoglobin, bilirubin, ketones and protein likely secondary to his profound vomiting and diarrhea.  His lipase is within normal meds.  Due to report of feeling feverish with leukocytosis and blood in his diarrhea, he is covered for infectious diarrhea with Zithromax.  Recommend follow-up with his primary care provider.  Consider follow-up with GI for reports of chronic blood in stools. I ordered medication including zofran, morphine, zithromax  for nausea, pain, enteritis   Reevaluation of the patient after these medicines showed that the patient improved I have reviewed the patients home medicines and have made adjustments as needed   Social Determinants of Health:  Lives with wife, has PCP   Test / Admission - Considered:  Tolerating POs, stable for dc         Final Clinical Impression(s) / ED Diagnoses Final diagnoses:  Enteritis  Dehydration  Nausea vomiting and diarrhea    Rx / DC Orders ED Discharge Orders          Ordered    azithromycin (ZITHROMAX) 500 MG tablet  Daily        05 /20/25 1808              Darlis Eisenmenger, PA-C 04/26/24 Oren Bing, MD 04/27/24 [phone]

## 2024-04-26 NOTE — ED Triage Notes (Signed)
 Pt is coming in for emesis that's has been going on since Sunday and has lead in to overall weakness and lethargy. He has had a couple episodes of vomiting today as well. Pt also has a headache, and muscle pain as well.

## 2024-04-26 NOTE — Discharge Instructions (Addendum)
 Home to rest and hydrate. Take Zithromax  daily starting tomorrow. Follow up with your PCP for recheck. Return for worsening or concerning symptoms.  Follow up with GI for recheck.

## 2024-04-26 NOTE — Progress Notes (Signed)
  Mark Duncan is a 53 y.o. male with the following history as recorded in [employer]:  Patient Active Problem List   Diagnosis Date Noted   Coronary artery calcification of native artery 04/25/2019   Mixed hyperlipidemia 04/25/2019   Atypical chest pain 05/24/2015   Rib pain on left side 05/24/2015   GERD (gastroesophageal reflux disease) 05/24/2015   History of bloody stools 05/24/2015   Smoker 05/24/2015    Current Outpatient Medications  Medication Sig Dispense Refill   amoxicillin -clavulanate (AUGMENTIN ) 875-125 MG tablet Take 1 tablet by mouth every 12 (twelve) hours. (Patient not taking: Reported on 04/26/2024) 14 tablet 0   No current facility-administered medications for this visit.    Allergies: Fish allergy  Past Medical History:  Diagnosis Date   Blood transfusion without reported diagnosis    GERD (gastroesophageal reflux disease)    Hyperlipidemia     Past Surgical History:  Procedure Laterality Date   HAND SURGERY     Glass removed   KNEE ARTHROSCOPY W/ MENISCAL REPAIR      Family History  Problem Relation Age of Onset   Heart disease Maternal Uncle    Heart disease Maternal Grandmother     Social History   Tobacco Use   Smoking status: Light Smoker    Types: Cigarettes   Smokeless tobacco: Former  Substance Use Topics   Alcohol use: No    Alcohol/week: 0.0 standard drinks of alcohol    Subjective:   2 day history of vomiting/ diarrhea/ fever; notes he has not had anything to eat or drink in the past 24 hours; concerned that may have COVID or flu;   Patient is actively vomiting in trash can in exam room and notes he is ready to go to the ER as soon as I walk into the room;    Objective:  Vitals:   04/26/24 1327  BP: (!) 142/78  Pulse: 64  Temp: 97.9 F (36.6 C)  TempSrc: Oral  SpO2: 99%  Weight: 184 lb 3.2 oz (83.6 kg)  Height: 6\' 1"  (1.854 m)    General: Well developed, well nourished, in mild distress- vomiting Skin : Warm and dry.   Head: Normocephalic and atraumatic  Lungs: Respirations unlabored;  Neurologic: Alert and oriented; speech intact; face symmetrical; moves all extremities well; CNII-XII intact without focal deficit   Assessment:  1. Acute cough   2. Vomiting without nausea, unspecified vomiting type     Plan:  Rapid flu and COVID both negative; patient is ill appearing in the office and in agreement that needs to go to ER for further evaluation/ IV fluids; did offer to transport him by wheelchair but patient prefers to walk; his wife is with him and walks with him for support.   He is encouraged to see his PCP in follow up after being seen at ER since he has not been seen here since 2023.   No follow-ups on file.  Orders Placed This Encounter  Procedures   POC COVID-19    Previously tested for COVID-19:   No    Resident in a congregate (group) care setting:   No    Employed in healthcare setting:   No   POC Influenza A&B (Binax test)    Requested Prescriptions    No prescriptions requested or ordered in this encounter

## 2024-04-28 ENCOUNTER — Emergency Department (HOSPITAL_BASED_OUTPATIENT_CLINIC_OR_DEPARTMENT_OTHER)
Admission: EM | Admit: 2024-04-28 | Discharge: 2024-04-28 | Disposition: A | Discharge status: Home / Self Care | Attending: Emergency Medicine | Admitting: Emergency Medicine

## 2024-04-28 ENCOUNTER — Encounter (HOSPITAL_BASED_OUTPATIENT_CLINIC_OR_DEPARTMENT_OTHER): Payer: Self-pay | Admitting: Emergency Medicine

## 2024-04-28 ENCOUNTER — Other Ambulatory Visit: Payer: Self-pay

## 2024-04-28 DIAGNOSIS — D72829 Elevated white blood cell count, unspecified: Secondary | ICD-10-CM | POA: Insufficient documentation

## 2024-04-28 DIAGNOSIS — R109 Unspecified abdominal pain: Secondary | ICD-10-CM | POA: Diagnosis not present

## 2024-04-28 DIAGNOSIS — K529 Noninfective gastroenteritis and colitis, unspecified: Secondary | ICD-10-CM | POA: Diagnosis not present

## 2024-04-28 LAB — URINALYSIS, ROUTINE W REFLEX MICROSCOPIC
Glucose, UA: NEGATIVE mg/dL
Ketones, ur: 40 mg/dL — AB
Leukocytes,Ua: NEGATIVE
Nitrite: NEGATIVE
Protein, ur: 100 mg/dL — AB
Specific Gravity, Urine: 1.025 (ref 1.005–1.030)
pH: 6.5 (ref 5.0–8.0)

## 2024-04-28 LAB — COMPREHENSIVE METABOLIC PANEL WITH GFR
ALT: 14 U/L (ref 0–44)
AST: 18 U/L (ref 15–41)
Albumin: 4.6 g/dL (ref 3.5–5.0)
Alkaline Phosphatase: 71 U/L (ref 38–126)
Anion gap: 14 (ref 5–15)
BUN: 11 mg/dL (ref 6–20)
CO2: 21 mmol/L — ABNORMAL LOW (ref 22–32)
Calcium: 9.8 mg/dL (ref 8.9–10.3)
Chloride: 100 mmol/L (ref 98–111)
Creatinine, Ser: 0.86 mg/dL (ref 0.61–1.24)
GFR, Estimated: >60 mL/min (ref >60)
Glucose, Bld: 99 mg/dL (ref 70–99)
Potassium: 3.8 mmol/L (ref 3.5–5.1)
Sodium: 135 mmol/L (ref 135–145)
Total Bilirubin: 0.7 mg/dL (ref 0.0–1.2)
Total Protein: 7.1 g/dL (ref 6.5–8.1)

## 2024-04-28 LAB — C DIFFICILE QUICK SCREEN W PCR REFLEX
C Diff antigen: NEGATIVE
C Diff interpretation: NOT DETECTED
C Diff toxin: NEGATIVE

## 2024-04-28 LAB — CBC
HCT: 49.6 % (ref 39.0–52.0)
Hemoglobin: 17.6 g/dL — ABNORMAL HIGH (ref 13.0–17.0)
MCH: 32.2 pg (ref 26.0–34.0)
MCHC: 35.5 g/dL (ref 30.0–36.0)
MCV: 90.7 fL (ref 80.0–100.0)
Platelets: 227 10*3/uL (ref 150–400)
RBC: 5.47 MIL/uL (ref 4.22–5.81)
RDW: 12.1 % (ref 11.5–15.5)
WBC: 15.2 10*3/uL — ABNORMAL HIGH (ref 4.0–10.5)
nRBC: 0 % (ref 0.0–0.2)

## 2024-04-28 LAB — URINALYSIS, MICROSCOPIC (REFLEX): WBC, UA: NONE SEEN WBC/hpf (ref 0–5)

## 2024-04-28 LAB — MAGNESIUM: Magnesium: 2.1 mg/dL (ref 1.7–2.4)

## 2024-04-28 LAB — LIPASE, BLOOD: Lipase: 22 U/L (ref 11–51)

## 2024-04-28 MED ORDER — SODIUM CHLORIDE 0.9 % IV BOLUS
1000.0000 mL | Freq: Once | INTRAVENOUS | Status: AC
  Administered 2024-04-28: 1000 mL via INTRAVENOUS

## 2024-04-28 MED ORDER — PANTOPRAZOLE SODIUM 40 MG IV SOLR
40.0000 mg | Freq: Once | INTRAVENOUS | Status: AC
  Administered 2024-04-28: 40 mg via INTRAVENOUS
  Filled 2024-04-28: qty 10

## 2024-04-28 NOTE — ED Provider Notes (Addendum)
 [place] [facility] Provider Note   CSN: [id number] Arrival date & time: 04/28/24  1520     History  Chief Complaint  Patient presents with   Diarrhea    Mark Duncan is a 53 y.o. male no pertinent past medical history presented with diarrhea since Monday.  Patient seen 2 days ago and diagnosed with enteritis and since has been to keep fluids down but states that he has had persistent diarrhea.  Patient Nuys any hematemesis, melena, hematochezia.  Patient's his abdomen diffusely hurts.  Patient is worried as he is urinating less but pushing fluids.  Patient's most concerned about dehydration.  Patient states that he took a Tums earlier today and his symptoms got better and that if he massages out his abdomen his abdominal discomfort gets better.  Patient denies any fevers, chest pain, shortness of breath.  Patient does know he was recently on Augmentin  3 weeks ago for a sinus infection.  Home Medications Prior to Admission medications   Medication Sig Start Date End Date Taking? Authorizing Provider  amoxicillin -clavulanate (AUGMENTIN ) 875-125 MG tablet Take 1 tablet by mouth every 12 (twelve) hours. Patient not taking: Reported on 04/26/2024 04/01/24   Harlow Duncan, Mark  N, FNP  azithromycin  (ZITHROMAX ) 500 MG tablet Take 1 tablet (500 mg total) by mouth daily for 4 days. Take first 2 tablets together, then 1 every day until finished. 04/26/24 04/30/24  Mark Eisenmenger, PA-C      Allergies    Fish allergy    Review of Systems   Review of Systems  Gastrointestinal:  Positive for diarrhea.    Physical Exam Updated Vital Signs BP 130/77 (BP Location: Left Arm)   Pulse 66   Temp 98 F (36.7 C) (Oral)   Resp 14   Ht 6\' 1"  (1.854 m)   Wt 83.6 kg   SpO2 100%   BMI 24.30 kg/m  Physical Exam Vitals reviewed.  Constitutional:      General: He is not in acute distress. HENT:     Head: Normocephalic and atraumatic.  Eyes:     Extraocular  Movements: Extraocular movements intact.     Conjunctiva/sclera: Conjunctivae normal.     Pupils: Pupils are equal, round, and reactive to light.  Cardiovascular:     Rate and Rhythm: Normal rate and regular rhythm.     Pulses: Normal pulses.     Heart sounds: Normal heart sounds.     Comments: 2+ bilateral radial/dorsalis pedis pulses with regular rate Pulmonary:     Effort: Pulmonary effort is normal. No respiratory distress.     Breath sounds: Normal breath sounds.  Abdominal:     Palpations: Abdomen is soft.     Tenderness: There is abdominal tenderness (Generalized). There is no guarding or rebound.  Musculoskeletal:        General: Normal range of motion.     Cervical back: Normal range of motion and neck supple.     Comments: 5 out of 5 bilateral grip/leg extension strength  Skin:    General: Skin is warm and dry.     Capillary Refill: Capillary refill takes less than 2 seconds.  Neurological:     General: No focal deficit present.     Mental Status: He is alert and oriented to person, place, and time.     Comments: Sensation intact in all 4 limbs  Psychiatric:        Mood and Affect: Mood normal.    ED  Results / Procedures / Treatments   Labs (all labs ordered are listed, but only abnormal results are displayed) Labs Reviewed  GASTROINTESTINAL PANEL BY PCR, STOOL (REPLACES STOOL CULTURE)  C DIFFICILE QUICK SCREEN W PCR REFLEX    LIPASE, BLOOD  COMPREHENSIVE METABOLIC PANEL WITH GFR  CBC  URINALYSIS, ROUTINE W REFLEX MICROSCOPIC  MAGNESIUM     EKG None  Radiology No results found.  Procedures Procedures    Medications Ordered in ED Medications  pantoprazole (PROTONIX) injection 40 mg (has no administration in time range)  sodium chloride 0.9 % bolus 1,000 mL (1,000 mLs Intravenous New Bag/Given 04/28/24 1754)    ED Course/ Medical Decision Making/ A&P                                 Medical Decision Making Amount and/or Complexity of Data  Reviewed Labs: ordered.  Risk Prescription drug management.   Mark Duncan 53 y.o. presented today for diarrhea.  Working DDx that I considered at this time includes, but not limited to, electrolyte abnormalities, dehydration, Campylobacter, Salmonella, EHEC, Shigella, Vibrio, Yersinia, IBD, Ischemic Colitis, Viral AGE, E. Coli, C. Diff, Nora/Rotavirus, Giardia, Listeria, Clostridium, IBD, IBS.  R/o DDx: pending  Review of prior external notes: None  Unique Tests and My Independent Interpretation:  CBC: Leukocytosis 15.2 CMP: Unremarkable Lipase: Unremarkable Magnesium : pending C. difficile: Pending GI panel: Pending UA: Moderate hemoglobin, small bilirubin, improving ketones  Social Determinants of Health: none  Discussion with Independent Historian: Wife  Discussion of Management of Tests: None  Risk: Medium: prescription drug management  Risk Stratification Score: None  Plan: On exam patient was in no acute distress with stable vitals.  On exam patient is able to tolerate electrolytes as he is doing this at the bedside.  Patient has generalized abdominal tenderness but no peritoneal signs.  Patient's labs and imaging from 2 days ago do show enteritis but otherwise had reassuring workup.  Will repeat labs and hold off on imaging and give fluids.  Patient was recently on antibiotics and so if patient is able to give stool sample we will test for C. difficile along with GI panel.  Upon reading previous PAs note on 04/26/2024 it appears patient's symptoms came after eating salad that did not taste right and so do suspect this is the cause of his symptoms.  Patient feels better after fluids and medication here.  Labs are at patient's baseline when compared to 2 days ago.  Patient had shared decision making and patient does see GI on Tuesday and still has azithromycin  at home.  Patient able to tolerate p.o. here and would like to try a brat diet and be discharged.  Patient  states he is comfortable being discharged before the GI panel and C. difficile test come back.  Will have him follow-up outpatient and strongly encouraged they continue his fluids and brat diet.  Patient I had shared decision-making and agreed to forego imaging at this time as patient had a CT scan 2 days ago that showed enteritis and he is not having any new symptoms and labs are essentially the same and patient states he feels comfortable following up outpatient and would like to be discharged.  This chart was dictated using voice recognition software.  Despite best efforts to proofread,  errors can occur which can change the documentation meaning.  Final Clinical Impression(s) / ED Diagnoses Final diagnoses:  None    Rx /  DC Orders ED Discharge Orders     None         Elex Grimmer 04/28/24 1610    Tegeler, Marine Sia, MD 04/28/24 2033

## 2024-04-28 NOTE — Discharge Instructions (Signed)
 Diet:  Begin with a bland diet, including foods such as bananas, rice, applesauce, and toast (BRAT diet).  Avoid dairy products, fatty foods, high-fiber foods, and caffeine until symptoms improve.  Maintain hydration with oral rehydration solutions (ORS) or clear fluids like water, broth, and oral rehydration salts. Sports drinks can be used but are not sufficient alone for severe dehydration.  Medications:  Over-the-counter loperamide (Imodium) can be used to reduce stool frequency in non-bloody diarrhea.  Bismuth subsalicylate (Pepto-Bismol) may help control stool passage and provide symptomatic relief.  May use Metamucil to help form stools.  Probiotics may be considered to shorten the duration of symptoms.  Red Flag Symptoms:  Seek immediate medical attention if any of the following occur:  Persistent high fever (>38.5C or 101.6F)  Bloody or mucoid stools  Signs of severe dehydration (e.g., dry mouth, decreased urination, dizziness)  Severe abdominal pain  Symptoms persisting beyond 48 hours despite treatment  Follow-Up:  Schedule a follow-up appointment with a gastroenterologist if symptoms persist beyond 48 hours or if there are recurrent episodes of diarrhea.

## 2024-04-28 NOTE — ED Triage Notes (Signed)
 Pt POV c/o possible dehydration, decreased urine output, good fluid intake. Denies emesis. Reports ongoing diarrhea since Monday. C/o abd cramping.    Pt declined lab draw in triage, wants to wait until in room.

## 2024-04-29 LAB — GASTROINTESTINAL PANEL BY PCR, STOOL (REPLACES STOOL CULTURE)

## 2024-05-03 DIAGNOSIS — R1010 Upper abdominal pain, unspecified: Secondary | ICD-10-CM | POA: Diagnosis not present

## 2024-05-03 DIAGNOSIS — K921 Melena: Secondary | ICD-10-CM | POA: Diagnosis not present

## 2024-05-03 DIAGNOSIS — R194 Change in bowel habit: Secondary | ICD-10-CM | POA: Diagnosis not present

## 2024-05-03 DIAGNOSIS — K219 Gastro-esophageal reflux disease without esophagitis: Secondary | ICD-10-CM | POA: Diagnosis not present

## 2024-05-18 DIAGNOSIS — K219 Gastro-esophageal reflux disease without esophagitis: Secondary | ICD-10-CM | POA: Diagnosis not present

## 2024-05-18 DIAGNOSIS — K3189 Other diseases of stomach and duodenum: Secondary | ICD-10-CM | POA: Diagnosis not present

## 2024-05-18 DIAGNOSIS — B9681 Helicobacter pylori [H. pylori] as the cause of diseases classified elsewhere: Secondary | ICD-10-CM | POA: Diagnosis not present

## 2024-05-18 DIAGNOSIS — D125 Benign neoplasm of sigmoid colon: Secondary | ICD-10-CM | POA: Diagnosis not present

## 2024-05-18 DIAGNOSIS — K635 Polyp of colon: Secondary | ICD-10-CM | POA: Diagnosis not present

## 2024-05-18 DIAGNOSIS — K259 Gastric ulcer, unspecified as acute or chronic, without hemorrhage or perforation: Secondary | ICD-10-CM | POA: Diagnosis not present

## 2024-05-18 DIAGNOSIS — K2930 Chronic superficial gastritis without bleeding: Secondary | ICD-10-CM | POA: Diagnosis not present

## 2024-05-18 DIAGNOSIS — D128 Benign neoplasm of rectum: Secondary | ICD-10-CM | POA: Diagnosis not present

## 2024-05-18 DIAGNOSIS — K2970 Gastritis, unspecified, without bleeding: Secondary | ICD-10-CM | POA: Diagnosis not present

## 2024-05-18 DIAGNOSIS — K921 Melena: Secondary | ICD-10-CM | POA: Diagnosis not present

## 2024-06-01 DIAGNOSIS — E785 Hyperlipidemia, unspecified: Secondary | ICD-10-CM | POA: Insufficient documentation

## 2024-06-01 DIAGNOSIS — Z5189 Encounter for other specified aftercare: Secondary | ICD-10-CM | POA: Insufficient documentation

## 2024-06-02 ENCOUNTER — Ambulatory Visit: Attending: Cardiology | Admitting: Cardiology

## 2024-06-02 ENCOUNTER — Encounter: Payer: Self-pay | Admitting: Cardiology

## 2024-06-02 VITALS — BP 100/62 | HR 60 | Ht 73.0 in | Wt 184.6 lb

## 2024-06-02 DIAGNOSIS — I2510 Atherosclerotic heart disease of native coronary artery without angina pectoris: Secondary | ICD-10-CM

## 2024-06-02 DIAGNOSIS — I700 Atherosclerosis of aorta: Secondary | ICD-10-CM | POA: Insufficient documentation

## 2024-06-02 DIAGNOSIS — I2584 Coronary atherosclerosis due to calcified coronary lesion: Secondary | ICD-10-CM

## 2024-06-02 DIAGNOSIS — R0789 Other chest pain: Secondary | ICD-10-CM

## 2024-06-02 DIAGNOSIS — R011 Cardiac murmur, unspecified: Secondary | ICD-10-CM | POA: Insufficient documentation

## 2024-06-02 DIAGNOSIS — F17200 Nicotine dependence, unspecified, uncomplicated: Secondary | ICD-10-CM

## 2024-06-02 DIAGNOSIS — E782 Mixed hyperlipidemia: Secondary | ICD-10-CM | POA: Diagnosis not present

## 2024-06-02 DIAGNOSIS — Z1329 Encounter for screening for other suspected endocrine disorder: Secondary | ICD-10-CM

## 2024-06-02 MED ORDER — NITROGLYCERIN 0.4 MG SL SUBL: 0.4000 mg | SUBLINGUAL_TABLET | SUBLINGUAL | 6 refills | Status: AC | PRN

## 2024-06-02 MED ORDER — ROSUVASTATIN CALCIUM 10 MG PO TABS
10.0000 mg | ORAL_TABLET | Freq: Every day | ORAL | 3 refills | Status: AC
Start: 2024-06-02 — End: 2025-05-28

## 2024-06-02 NOTE — Patient Instructions (Signed)
 Medication Instructions:  Your physician has recommended you make the following change in your medication:   Start Rosuvastatin  10 mg daily  Use nitroglycerin 1 tablet placed under the tongue at the first sign of chest pain or an angina attack. 1 tablet may be used every 5 minutes as needed, for up to 15 minutes. Do not take more than 3 tablets in 15 minutes. If pain persist call 911 or go to the nearest ED.   *If you need a refill on your cardiac medications before your next appointment, please call your pharmacy*   Lab Work: Your physician recommends that you have a CMP, CBC, TSH and lipids today in the office.  Your physician recommends that you return for lab work in: 6 weeks for fasting lipids and CMP You need to have labs done when you are fasting.  You can come Monday through Friday 8:30 am to 12:00 pm and 1:15 to 4:30. You do not need to make an appointment as the order has already been placed.   If you have labs (blood work) drawn today and your tests are completely normal, you will receive your results only by: MyChart Message (if you have MyChart) OR A paper copy in the mail If you have any lab test that is abnormal or we need to change your treatment, we will call you to review the results.   Testing/Procedures:  Stress Echocardiogram Instructions:    1. You may take all of your medications.  2. No food, drink or tobacco products four hours prior to your test.  3. Dress prepared to exercise. Best to wear 2 piece outfit and tennis shoes. Shoes must be closed toe.  4. Please bring all current prescription medications.  Your physician has requested that you have an echocardiogram. Echocardiography is a painless test that uses sound waves to create images of your heart. It provides your doctor with information about the size and shape of your heart and how well your heart's chambers and valves are working. This procedure takes approximately one hour. There are no  restrictions for this procedure. Please do NOT wear cologne, perfume, aftershave, or lotions (deodorant is allowed). Please arrive 15 minutes prior to your appointment time.  Please note: We ask at that you not bring children with you during ultrasound (echo/ vascular) testing. Due to room size and safety concerns, children are not allowed in the ultrasound rooms during exams. Our front office staff cannot provide observation of children in our lobby area while testing is being conducted. An adult accompanying a patient to their appointment will only be allowed in the ultrasound room at the discretion of the ultrasound technician under special circumstances. We apologize for any inconvenience.    Follow-Up: At [facility], you and your health needs are our priority.  As part of our continuing mission to provide you with exceptional heart care, we have created designated Provider Care Teams.  These Care Teams include your primary Cardiologist (physician) and Advanced Practice Providers (APPs -  Physician Assistants and Nurse Practitioners) who all work together to provide you with the care you need, when you need it.  We recommend signing up for the patient portal called MyChart.  Sign up information is provided on this After Visit Summary.  MyChart is used to connect with patients for Virtual Visits (Telemedicine).  Patients are able to view lab/test results, encounter notes, upcoming appointments, etc.  Non-urgent messages can be sent to your provider as well.   To learn more  about what you can do with MyChart, go to [email or web address].    Your next appointment:   9 month(s)  The format for your next appointment:   In Person  Provider:   Jennifer Crape, MD   Other Instructions Exercise Stress Echocardiogram An exercise stress echocardiogram is a test to check how well your heart is working. This test uses sound waves and a computer to make pictures of your heart. These pictures  will be taken before and after you exercise. For this test, you will walk on a treadmill or ride a bicycle to make your heart beat faster. While you exercise, your heart will be checked with an electrocardiogram (ECG). Your blood pressure will also be checked. You may have this test if: You have chest pain or a heart problem. You had a heart attack or heart surgery not long ago. You have heart valve problems. You have a condition that causes narrowing of the blood vessels that supply your heart. You have a high risk of heart disease and: You are starting a new exercise program. You need to have a big surgery. Tell a doctor about: Any allergies you have. All medicines you are taking. This includes vitamins, herbs, eye drops, creams, and over-the-counter medicines. Any problems you or family members have had with medicines that make you fall asleep (anesthetic medicines). Any surgeries you have had. Any blood disorders you have. Any medical conditions you have. Whether you are pregnant or may be pregnant. What are the risks? Generally, this is a safe test. However, problems may occur, including: Chest pain. Feeling dizzy or light-headed. Shortness of breath. Increased or irregular heartbeat. Feeling like you may vomit (nausea) or vomiting. Heart attack. This is very rare. What happens before the test? Medicines Ask your doctor about changing or stopping your normal medicines. This is important if you take diabetes medicines or blood thinners. If you use an inhaler, bring it to the test. General instructions Wear comfortable clothes and walking shoes. Follow instructions from your doctor about what you cannot eat or drink before the test. Do not drink or eat anything that has caffeine in it. Stop having caffeine 24 hours before the test. Do not smoke or use products that contain nicotine or tobacco for 4 hours before the test. If you need help quitting, ask your doctor. What happens  during the test?  You will take off your clothes from the waist up and put on a hospital gown. Electrodes or patches will be put on your chest. A blood pressure cuff will be put on your arm. Before you exercise, a computer will make a picture of your heart. To do this: You will lie down and a gel will be put on your chest. A wand will be moved over the gel. Sound waves from the wand will go to the computer to make the picture. Then, you will start to exercise. You may walk on a treadmill or pedal a bicycle. Your blood pressure and heart rhythm will be checked while you exercise. The exercise will get harder or faster. You will exercise until: Your heart reaches a certain level. You are too tired to go on. You cannot go on because of chest pain, weakness, or dizziness. You will lie down right away so another picture of your heart can be taken. The procedure may vary among doctors and hospitals. What can I expect after the test? After your test, it is common to have: Mild soreness. Mild tiredness. Your heart rate  and blood pressure will be checked until they return to your normal levels. You should not have any new symptoms after this test. Follow these instructions at home: If your doctor says that you can, you may: Eat what you normally eat. Do your normal activities. Take over-the-counter and prescription medicines only as told by your doctor. Keep all follow-up visits. It is up to you to get the results of your test. Ask how to get your results when they are ready. Contact a doctor if: You feel dizzy or light-headed. You have a fast or irregular heartbeat. You feel like you may vomit or you vomit. You have a headache. You feel short of breath. Get help right away if: You develop pain or pressure: In your chest. In your jaw or neck. Between your shoulders. That goes down your left arm. You faint. You have trouble breathing. These symptoms may be an emergency. Get medical  help right away. Call your local emergency services (911 in the [place] Do not wait to see if the symptoms will go away. Do not drive yourself to the hospital. Summary This is a test that checks how well your heart is working. Follow instructions about what you cannot eat or drink before the test. Ask your doctor if you should take your normal medicines before the test. Stop having caffeine 24 hours before the test. Do not smoke or use products with nicotine or tobacco in them for 4 hours before the test. During the test, your blood pressure and heart rhythm will be checked while you exercise. This information is not intended to replace advice given to you by your health care provider. Make sure you discuss any questions you have with your health care provider. Document Revised: 08/07/2021 Document Reviewed: 07/17/2020 Elsevier Patient Education  2022 [employer]  Echocardiogram An echocardiogram is a test that uses sound waves to make images of your heart. This way of making images is often called ultrasound. The images from this test can help find out many things about your heart, including: The size and shape of your heart. The strength of your heart muscle and how well it's working. The size, thickness, and movement of your heart's walls. How your heart valves are working. Problems such as: A tumor or a growth from an infection around the heart valves. Areas of heart muscle that aren't working well because of poor blood flow or injury from a heart attack. An aneurysm. This is a weak or damaged part of an artery wall. An artery is a blood vessel. Tell a health care provider about: Any allergies you have. All medicines you're taking, including vitamins, herbs, eye drops, creams, and over-the-counter medicines. Any bleeding problems you have. Any surgeries you've had. Any medical problems you have. Whether you're pregnant or may be pregnant. What are the risks? Your health care  provider will talk with you about risks. These may include an allergic reaction to IV dye that may be used during the test. What happens before the test? You don't need to do anything to get ready for this test. You may eat and drink normally. What happens during the test?  You'll take off your clothes from the waist up and put on a hospital gown. Sticky patches called electrodes may be placed on your chest. These will be connected to a machine that monitors your heart rate and rhythm. You'll lie down on a table for the exam. A wand covered in gel will be moved over your chest.  Sound waves from the wand will go to your heart and bounce back--or echo back. The sound waves will go to a computer that uses them to make images of your heart. The images can be viewed on a monitor. The images will also be recorded on the computer so your provider can look at them later. You may be asked to change positions or hold your breath for a short time. This makes it easier to get different views or better views of your heart. In some cases, you may be given a dye through an IV. The IV is put into one of your veins. This dye can make the areas of your heart easier to see. The procedure may vary among providers and hospitals. What can I expect after the test? You may return to your normal diet, activities, and medicines unless your provider tells you not to. If an IV was placed for the test, it will be removed. It's up to you to get the results of your test. Ask your provider, or the department that's doing the test, when your results will be ready. This information is not intended to replace advice given to you by your health care provider. Make sure you discuss any questions you have with your health care provider. Document Revised: 01/23/2023 Document Reviewed: 01/23/2023 Elsevier Patient Education  2024 [employer].

## 2024-06-02 NOTE — Progress Notes (Signed)
 Cardiology Office Note:    Date:  06/02/2024   ID:  NTHONY LEFFERTS, DOB 1971-03-09, MRN [id number]  PCP:  Dorina Loving, PA-C  Cardiologist:  Jennifer JONELLE Crape, MD   Referring MD: Dorina Loving, PA-C    ASSESSMENT:    1. Mixed hyperlipidemia   2. Aortic atherosclerosis (HCC)   3. Atypical chest pain   4. Cardiac murmur    PLAN:    In order of problems listed above:  Aortic atherosclerosis: Secondary prevention stressed with the patient.  Importance of compliance with diet medication stressed any vocalized understanding. Chest pain: Atypical in nature.  Reassuring mild exercise stress echo.  He is agreeable. Mixed dyslipidemia: I reviewed lab work that was done recently at the hospital.  I will do blood work today also.  He is fasting.  Will initiate rosuvastatin  10 mg daily and liver lipid check in 6 weeks.  Diet emphasized. Patient will be seen in follow-up appointment in 6 months or earlier if the patient has any concerns.   Medication Adjustments/Labs and Tests Ordered: Current medicines are reviewed at length with the patient today.  Concerns regarding medicines are outlined above.  Orders Placed This Encounter  Procedures   EKG 12-Lead   No orders of the defined types were placed in this encounter.    History of Present Illness:    Mark Duncan is a 53 y.o. male who is being seen today for the evaluation of chest pain at the request of Saguier, Loving, PA-C.  Patient is a pleasant 53 year old male.  He has past medical history of aortic atherosclerosis, mixed dyslipidemia.  He mentions to me that he has chest discomfort.  This is not related to exertion.  No radiation to the neck or to the arms.  Occurs at rest and not with activity.  He is an active gentleman.  He does not exercise on a regular basis.  At the time of my evaluation, the patient is alert awake oriented and in no distress.  Past Medical History:  Diagnosis Date   Atypical chest pain  05/24/2015   Blood transfusion without reported diagnosis    Cardiac murmur 06/02/2024   Coronary artery calcification of native artery 04/25/2019   GERD (gastroesophageal reflux disease)    History of bloody stools 05/24/2015   Hyperlipidemia    Mixed hyperlipidemia 04/25/2019   Rib pain on left side 05/24/2015   Smoker 05/24/2015    Past Surgical History:  Procedure Laterality Date   HAND SURGERY     Glass removed   KNEE ARTHROSCOPY W/ MENISCAL REPAIR      Current Medications: Current Meds  Medication Sig   fexofenadine-pseudoephedrine (ALLEGRA-D 24) 180-240 MG 24 hr tablet Take 1 tablet by mouth daily.   omeprazole  (PRILOSEC) 40 MG capsule Take 40 mg by mouth daily.     Allergies:   Fish allergy   Social History   Socioeconomic History   Marital status: Married    Spouse name: Not on file   Number of children: Not on file   Years of education: Not on file   Highest education level: Not on file  Occupational History   Not on file  Tobacco Use   Smoking status: Light Smoker    Types: Cigarettes   Smokeless tobacco: Former  Advertising account planner   Vaping status: Never Used  Substance and Sexual Activity   Alcohol use: No    Alcohol/week: 0.0 standard drinks of alcohol   Drug use: Yes  Types: Marijuana   Sexual activity: Not on file  Other Topics Concern   Not on file  Social History Narrative   Not on file   Social Drivers of Health   Financial Resource Strain: Not on file  Food Insecurity: Not on file  Transportation Needs: Not on file  Physical Activity: Not on file  Stress: Not on file  Social Connections: Not on file     Family History: The patient's family history includes Heart disease in his maternal grandmother and maternal uncle.  ROS:   Please see the history of present illness.    All other systems reviewed and are negative.  EKGs/Labs/Other Studies Reviewed:    The following studies were reviewed today:  EKG  Interpretation Date/Time:  Thursday June 02 2024 08:35:06 EDT Ventricular Rate:  60 PR Interval:  146 QRS Duration:  78 QT Interval:  368 QTC Calculation: 368 R Axis:   82  Text Interpretation: Normal sinus rhythm Possible Anterior infarct , age undetermined Abnormal ECG When compared with ECG of 26-Apr-2024 14:31, PREVIOUS ECG IS PRESENT Confirmed by Edwyna Backers [phone]) on 06/02/2024 8:50:07 AM     Recent Labs: 04/28/2024: ALT 14; BUN 11; Creatinine, Ser 0.86; Hemoglobin 17.6; Magnesium  2.1; Platelets 227; Potassium 3.8; Sodium 135  Recent Lipid Panel    Component Value Date/Time   CHOL 190 07/09/2022 1420   TRIG 61.0 07/09/2022 1420   HDL 36.70 (L) 07/09/2022 1420   CHOLHDL 5 07/09/2022 1420   VLDL 12.2 07/09/2022 1420   LDLCALC 141 (H) 07/09/2022 1420    Physical Exam:    VS:  BP 100/62   Pulse 60   Ht 6' 1 (1.854 m)   Wt 184 lb 9.6 oz (83.7 kg)   SpO2 96%   BMI 24.36 kg/m     Wt Readings from Last 3 Encounters:  06/02/24 184 lb 9.6 oz (83.7 kg)  04/28/24 184 lb 3.2 oz (83.6 kg)  04/26/24 184 lb 3.2 oz (83.6 kg)     GEN: Patient is in no acute distress HEENT: Normal NECK: No JVD; No carotid bruits LYMPHATICS: No lymphadenopathy CARDIAC: S1 S2 regular, 2/6 systolic murmur at the apex. RESPIRATORY:  Clear to auscultation without rales, wheezing or rhonchi  ABDOMEN: Soft, non-tender, non-distended MUSCULOSKELETAL:  No edema; No deformity  SKIN: Warm and dry NEUROLOGIC:  Alert and oriented x 3 PSYCHIATRIC:  Normal affect    Signed, Backers JONELLE Edwyna, MD  06/02/2024 9:02 AM    [facility]

## 2024-06-03 LAB — COMPREHENSIVE METABOLIC PANEL WITH GFR
ALT: 11 IU/L (ref 0–44)
AST: 13 IU/L (ref 0–40)
Albumin: 4.3 g/dL (ref 3.8–4.9)
Alkaline Phosphatase: 76 IU/L (ref 44–121)
BUN/Creatinine Ratio: 8 — ABNORMAL LOW (ref 9–20)
BUN: 8 mg/dL (ref 6–24)
Bilirubin Total: 0.3 mg/dL (ref 0.0–1.2)
CO2: 21 mmol/L (ref 20–29)
Calcium: 9.6 mg/dL (ref 8.7–10.2)
Chloride: 105 mmol/L (ref 96–106)
Creatinine, Ser: 1.03 mg/dL (ref 0.76–1.27)
Globulin, Total: 2.2 g/dL (ref 1.5–4.5)
Glucose: 87 mg/dL (ref 70–99)
Potassium: 4.5 mmol/L (ref 3.5–5.2)
Sodium: 141 mmol/L (ref 134–144)
Total Protein: 6.5 g/dL (ref 6.0–8.5)
eGFR: 88 mL/min/{1.73_m2} (ref >59)

## 2024-06-03 LAB — CBC WITH DIFFERENTIAL/PLATELET
Basophils Absolute: 0.1 10*3/uL (ref 0.0–0.2)
Basos: 1 %
EOS (ABSOLUTE): 0.2 10*3/uL (ref 0.0–0.4)
Eos: 2 %
Hematocrit: 49.8 % (ref 37.5–51.0)
Hemoglobin: 16.4 g/dL (ref 13.0–17.7)
Immature Grans (Abs): 0 10*3/uL (ref 0.0–0.1)
Immature Granulocytes: 0 %
Lymphocytes Absolute: 3.1 10*3/uL (ref 0.7–3.1)
Lymphs: 33 %
MCH: 32.3 pg (ref 26.6–33.0)
MCHC: 32.9 g/dL (ref 31.5–35.7)
MCV: 98 fL — ABNORMAL HIGH (ref 79–97)
Monocytes Absolute: 0.6 10*3/uL (ref 0.1–0.9)
Monocytes: 6 %
Neutrophils Absolute: 5.4 10*3/uL (ref 1.4–7.0)
Neutrophils: 58 %
Platelets: 176 10*3/uL (ref 150–450)
RBC: 5.08 x10E6/uL (ref 4.14–5.80)
RDW: 12.1 % (ref 11.6–15.4)
WBC: 9.3 10*3/uL (ref 3.4–10.8)

## 2024-06-03 LAB — LIPID PANEL
Chol/HDL Ratio: 5.4 ratio — ABNORMAL HIGH (ref 0.0–5.0)
Cholesterol, Total: 204 mg/dL — ABNORMAL HIGH (ref 100–199)
HDL: 38 mg/dL — ABNORMAL LOW (ref >39)
LDL Chol Calc (NIH): 154 mg/dL — ABNORMAL HIGH (ref 0–99)
Triglycerides: 67 mg/dL (ref 0–149)
VLDL Cholesterol Cal: 12 mg/dL (ref 5–40)

## 2024-06-03 LAB — TSH: TSH: 0.938 u[IU]/mL (ref 0.450–4.500)

## 2024-06-07 ENCOUNTER — Ambulatory Visit: Payer: Self-pay | Admitting: Cardiology

## 2024-06-07 DIAGNOSIS — I77810 Thoracic aortic ectasia: Secondary | ICD-10-CM

## 2024-06-07 DIAGNOSIS — E782 Mixed hyperlipidemia: Secondary | ICD-10-CM

## 2024-06-13 NOTE — Telephone Encounter (Signed)
-----   Message from Vinton Revankar sent at 06/07/2024  2:24 PM EDT ----- Markedly elevated lipids.  Double statin and liver lipid check in 6 weeks.  Diet.  Copy primary Jennifer JONELLE Crape, MD 06/07/2024 2:24 PM  ----- Message ----- From: Rebecka Memos Lab Results In Sent: 06/03/2024   5:37 AM EDT To: Jennifer JONELLE Crape, MD

## 2024-06-13 NOTE — Telephone Encounter (Signed)
 MyChart message

## 2024-06-23 ENCOUNTER — Telehealth (HOSPITAL_COMMUNITY): Payer: Self-pay | Admitting: *Deleted

## 2024-06-23 NOTE — Telephone Encounter (Signed)
 Left detailed instructions for stress echo.

## 2024-06-24 ENCOUNTER — Ambulatory Visit (HOSPITAL_COMMUNITY)
Admission: RE | Admit: 2024-06-24 | Discharge: 2024-06-24 | Disposition: A | Discharge status: Home / Self Care | Source: Ambulatory Visit | Attending: Cardiology | Admitting: Cardiology

## 2024-06-24 DIAGNOSIS — R011 Cardiac murmur, unspecified: Secondary | ICD-10-CM | POA: Diagnosis not present

## 2024-06-24 DIAGNOSIS — R0789 Other chest pain: Secondary | ICD-10-CM | POA: Diagnosis not present

## 2024-06-27 LAB — ECHOCARDIOGRAM COMPLETE
Area-P 1/2: 2.71 cm2
S' Lateral: 3.48 cm

## 2024-06-29 NOTE — Telephone Encounter (Signed)
-----   Message from Jennifer SAUNDERS Revankar sent at 06/28/2024  1:10 PM EDT ----- Echo is fine.  Will need CT to check aneurysm.  Copy primary Jennifer SAUNDERS Crape, MD 06/28/2024 1:10 PM  ----- Message ----- From: Interface, [employer] Sent: 06/27/2024   7:32 AM EDT To: Jennifer SAUNDERS Crape, MD

## 2024-06-30 ENCOUNTER — Encounter (HOSPITAL_COMMUNITY): Payer: Self-pay

## 2024-06-30 ENCOUNTER — Ambulatory Visit (HOSPITAL_COMMUNITY)

## 2024-06-30 ENCOUNTER — Inpatient Hospital Stay (HOSPITAL_COMMUNITY): Admission: RE | Admit: 2024-06-30 | Source: Ambulatory Visit | Attending: Cardiology | Admitting: Cardiology

## 2024-07-13 ENCOUNTER — Encounter (HOSPITAL_COMMUNITY): Payer: Self-pay | Admitting: *Deleted

## 2024-07-28 ENCOUNTER — Ambulatory Visit (HOSPITAL_COMMUNITY): Admission: RE | Admit: 2024-07-28 | Source: Ambulatory Visit | Attending: Cardiology | Admitting: Cardiology

## 2024-07-28 ENCOUNTER — Ambulatory Visit (HOSPITAL_COMMUNITY): Admission: RE | Admit: 2024-07-28 | Source: Ambulatory Visit

## 2024-07-28 ENCOUNTER — Encounter (HOSPITAL_COMMUNITY): Payer: Self-pay | Admitting: Cardiology

## 2024-08-24 ENCOUNTER — Encounter: Payer: Self-pay | Admitting: Cardiology
